# Patient Record
Sex: Male | Born: 1955 | Race: White | Hispanic: No | Marital: Single | Smoking: Never smoker
Health system: Southern US, Community
[De-identification: ages and names within clinical notes are randomized; demographics above are authoritative.]

## PROBLEM LIST (undated history)

## (undated) DIAGNOSIS — N310 Uninhibited neuropathic bladder, not elsewhere classified: Secondary | ICD-10-CM

## (undated) DIAGNOSIS — N189 Chronic kidney disease, unspecified: Secondary | ICD-10-CM

## (undated) DIAGNOSIS — G909 Disorder of the autonomic nervous system, unspecified: Secondary | ICD-10-CM

## (undated) DIAGNOSIS — D649 Anemia, unspecified: Secondary | ICD-10-CM

## (undated) DIAGNOSIS — G9340 Encephalopathy, unspecified: Secondary | ICD-10-CM

## (undated) DIAGNOSIS — R339 Retention of urine, unspecified: Secondary | ICD-10-CM

## (undated) DIAGNOSIS — J9690 Respiratory failure, unspecified, unspecified whether with hypoxia or hypercapnia: Secondary | ICD-10-CM

## (undated) DIAGNOSIS — L89153 Pressure ulcer of sacral region, stage 3: Secondary | ICD-10-CM

## (undated) DIAGNOSIS — J189 Pneumonia, unspecified organism: Secondary | ICD-10-CM

## (undated) DIAGNOSIS — R1310 Dysphagia, unspecified: Secondary | ICD-10-CM

## (undated) DIAGNOSIS — G8250 Quadriplegia, unspecified: Secondary | ICD-10-CM

## (undated) HISTORY — PX: TRACHEOSTOMY: SUR1362

---

## 2019-04-29 ENCOUNTER — Emergency Department (HOSPITAL_COMMUNITY)
Admission: EM | Admit: 2019-04-29 | Discharge: 2019-04-29 | Disposition: A | Discharge status: Home / Self Care | Payer: No Typology Code available for payment source | Attending: Emergency Medicine | Admitting: Emergency Medicine

## 2019-04-29 ENCOUNTER — Encounter (HOSPITAL_COMMUNITY): Payer: Self-pay | Admitting: Emergency Medicine

## 2019-04-29 ENCOUNTER — Emergency Department (HOSPITAL_COMMUNITY): Payer: No Typology Code available for payment source

## 2019-04-29 DIAGNOSIS — N3001 Acute cystitis with hematuria: Secondary | ICD-10-CM | POA: Diagnosis not present

## 2019-04-29 DIAGNOSIS — Z20828 Contact with and (suspected) exposure to other viral communicable diseases: Secondary | ICD-10-CM | POA: Diagnosis not present

## 2019-04-29 DIAGNOSIS — R0902 Hypoxemia: Secondary | ICD-10-CM | POA: Diagnosis present

## 2019-04-29 HISTORY — DX: Pneumonia, unspecified organism: J18.9

## 2019-04-29 HISTORY — DX: Quadriplegia, unspecified: G82.50

## 2019-04-29 HISTORY — DX: Anemia, unspecified: D64.9

## 2019-04-29 HISTORY — DX: Respiratory failure, unspecified, unspecified whether with hypoxia or hypercapnia: J96.90

## 2019-04-29 LAB — POCT I-STAT 7, (LYTES, BLD GAS, ICA,H+H)
Acid-Base Excess: 8 mmol/L — ABNORMAL HIGH (ref 0.0–2.0)
Bicarbonate: 31.8 mmol/L — ABNORMAL HIGH (ref 20.0–28.0)
Calcium, Ion: 1.25 mmol/L (ref 1.15–1.40)
HCT: 24 % — ABNORMAL LOW (ref 39.0–52.0)
Hemoglobin: 8.2 g/dL — ABNORMAL LOW (ref 13.0–17.0)
O2 Saturation: 95 %
Patient temperature: 97.5
Potassium: 4 mmol/L (ref 3.5–5.1)
Sodium: 143 mmol/L (ref 135–145)
TCO2: 33 mmol/L — ABNORMAL HIGH (ref 22–32)
pCO2 arterial: 39 mmHg (ref 32.0–48.0)
pH, Arterial: 7.517 — ABNORMAL HIGH (ref 7.350–7.450)
pO2, Arterial: 65 mmHg — ABNORMAL LOW (ref 83.0–108.0)

## 2019-04-29 LAB — URINALYSIS, ROUTINE W REFLEX MICROSCOPIC
Bilirubin Urine: NEGATIVE
Glucose, UA: NEGATIVE mg/dL
Hgb urine dipstick: NEGATIVE
Ketones, ur: NEGATIVE mg/dL
Nitrite: NEGATIVE
Protein, ur: 30 mg/dL — AB
Specific Gravity, Urine: 1.015 (ref 1.005–1.030)
WBC, UA: >50 WBC/hpf — ABNORMAL HIGH (ref 0–5)
pH: 8 (ref 5.0–8.0)

## 2019-04-29 LAB — COMPREHENSIVE METABOLIC PANEL
ALT: 82 U/L — ABNORMAL HIGH (ref 0–44)
AST: 38 U/L (ref 15–41)
Albumin: 3 g/dL — ABNORMAL LOW (ref 3.5–5.0)
Alkaline Phosphatase: 86 U/L (ref 38–126)
Anion gap: 16 — ABNORMAL HIGH (ref 5–15)
BUN: 58 mg/dL — ABNORMAL HIGH (ref 8–23)
CO2: 28 mmol/L (ref 22–32)
Calcium: 10 mg/dL (ref 8.9–10.3)
Chloride: 99 mmol/L (ref 98–111)
Creatinine, Ser: 0.57 mg/dL — ABNORMAL LOW (ref 0.61–1.24)
GFR calc Af Amer: >60 mL/min (ref >60)
GFR calc non Af Amer: >60 mL/min (ref >60)
Glucose, Bld: 99 mg/dL (ref 70–99)
Potassium: 4.3 mmol/L (ref 3.5–5.1)
Sodium: 143 mmol/L (ref 135–145)
Total Bilirubin: 0.4 mg/dL (ref 0.3–1.2)
Total Protein: 7.7 g/dL (ref 6.5–8.1)

## 2019-04-29 LAB — CBC WITH DIFFERENTIAL/PLATELET
Abs Immature Granulocytes: 0.11 10*3/uL — ABNORMAL HIGH (ref 0.00–0.07)
Basophils Absolute: 0 10*3/uL (ref 0.0–0.1)
Basophils Relative: 0 %
Eosinophils Absolute: 0.7 10*3/uL — ABNORMAL HIGH (ref 0.0–0.5)
Eosinophils Relative: 6 %
HCT: 27.8 % — ABNORMAL LOW (ref 39.0–52.0)
Hemoglobin: 8.5 g/dL — ABNORMAL LOW (ref 13.0–17.0)
Immature Granulocytes: 1 %
Lymphocytes Relative: 16 %
Lymphs Abs: 2 10*3/uL (ref 0.7–4.0)
MCH: 30.2 pg (ref 26.0–34.0)
MCHC: 30.6 g/dL (ref 30.0–36.0)
MCV: 98.9 fL (ref 80.0–100.0)
Monocytes Absolute: 0.9 10*3/uL (ref 0.1–1.0)
Monocytes Relative: 7 %
Neutro Abs: 8.5 10*3/uL — ABNORMAL HIGH (ref 1.7–7.7)
Neutrophils Relative %: 70 %
Platelets: 204 10*3/uL (ref 150–400)
RBC: 2.81 MIL/uL — ABNORMAL LOW (ref 4.22–5.81)
RDW: 16.5 % — ABNORMAL HIGH (ref 11.5–15.5)
WBC: 12.2 10*3/uL — ABNORMAL HIGH (ref 4.0–10.5)
nRBC: 0 % (ref 0.0–0.2)

## 2019-04-29 LAB — SARS CORONAVIRUS 2 BY RT PCR (HOSPITAL ORDER, PERFORMED IN ~~LOC~~ HOSPITAL LAB): SARS Coronavirus 2: NEGATIVE

## 2019-04-29 LAB — LACTIC ACID, PLASMA: Lactic Acid, Venous: 1.1 mmol/L (ref 0.5–1.9)

## 2019-04-29 MED ORDER — VANCOMYCIN HCL IN DEXTROSE 1-5 GM/200ML-% IV SOLN
1000.0000 mg | Freq: Once | INTRAVENOUS | Status: AC
  Administered 2019-04-29: 06:00:00 1000 mg via INTRAVENOUS
  Filled 2019-04-29: qty 200

## 2019-04-29 MED ORDER — SODIUM CHLORIDE 0.9 % IV BOLUS (SEPSIS)
1000.0000 mL | Freq: Once | INTRAVENOUS | Status: AC
  Administered 2019-04-29: 06:00:00 1000 mL via INTRAVENOUS

## 2019-04-29 MED ORDER — CIPROFLOXACIN HCL 500 MG PO TABS: 500.0000 mg | ORAL_TABLET | Freq: Two times a day (BID) | ORAL | 0 refills | Status: DC

## 2019-04-29 MED ORDER — PIPERACILLIN-TAZOBACTAM 3.375 G IVPB 30 MIN
3.3750 g | Freq: Once | INTRAVENOUS | Status: AC
  Administered 2019-04-29: 06:00:00 3.375 g via INTRAVENOUS
  Filled 2019-04-29: qty 50

## 2019-04-29 NOTE — ED Notes (Signed)
Called patient brother [phone] updated on status and aware patient will be returning to [facility]
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
## 2019-04-29 NOTE — ED Triage Notes (Signed)
Patient arrived with [facility] from [facility] staff reported pt. desaturated with possible seizure episode " blank stare" last night , sent here for evaluation , pt. is ventilator dependent /quadriphlegic . Chest x-ray result done at [facility] shows mild bibasilar infiltrates.

## 2019-04-29 NOTE — Discharge Instructions (Addendum)
Patient has had normal blood pressures and normal oxygen saturations here in the emergency department.  His work-up was unremarkable other than mildly elevated white blood cell count of 12,000, hemoglobin of 8.5 and a mild urinary tract infection.  Blood and urine cultures have been sent.  Patient has received 2 L of IV fluids as well as IV vancomycin and Zosyn.  I feel he can be discharged back to [facility] nursing facility.  These episodes of passing out versus seizures sound more like syncopal events possibly related to hypotension.  Patient is concerned that his hypotension is related to not being able to eat and drink and would like for a swallow screen to be completed.  He has asked for me to request this from the on call physician at [facility].

## 2019-04-29 NOTE — ED Notes (Signed)
Pt has left with [facility]. [facility] informed pt is in route.

## 2019-04-30 LAB — URINE CULTURE: Culture: 20000 — AB

## 2019-05-01 ENCOUNTER — Telehealth: Payer: Self-pay | Admitting: *Deleted

## 2019-05-01 NOTE — Progress Notes (Signed)
ED Antimicrobial Stewardship Positive Culture Follow Up   Francis Osborne is an 63 y.o. male who presented to [facility] on 04/29/2019 with a chief complaint of hypoxia and possible seizure-like episode from [facility]. Patient had no documented symptoms of UTI. UA showed large leukocytes, negative nitrites, rare bacteria, 6-10 squamous epithelial cells, and > 50 WBC. Patient sent home on ciprofloxacin due to history of pseudomonas in urine before. Urine culture resulted showing patient grew 20K colonies/mL of yeast.  Plan: No change in treatment since patient had no UTI symptoms   Chief Complaint  Patient presents with  . Desaturated on [facility]   . ? Seizure    Recent Results (from the past 720 hour(s))  Blood Culture (routine x 2)     Status: None (Preliminary result)   Collection Time: 04/29/19  5:20 AM  Result Value Ref Range Status   Specimen Description BLOOD RIGHT FOREARM  Final   Special Requests   Final    BOTTLES DRAWN AEROBIC AND ANAEROBIC Blood Culture results may not be optimal due to an inadequate volume of blood received in culture bottles   Culture   Final    NO GROWTH 1 DAY Performed at [facility], [address], [place], [place] [address]    Report Status PENDING  Incomplete  Urine culture     Status: Abnormal   Collection Time: 04/29/19  5:31 AM  Result Value Ref Range Status   Specimen Description URINE, RANDOM  Final   Special Requests   Final    NONE Performed at [facility], [address] [place], [place] [address]    Culture 20,000 COLONIES/mL YEAST (A)  Final   Report Status 04/30/2019 FINAL  Final  SARS Coronavirus 2 [facility] order, Performed in [facility] hospital lab)     Status: None   Collection Time: 04/29/19  5:32 AM  Result Value Ref Range Status   SARS Coronavirus 2 NEGATIVE NEGATIVE Final    Comment: (NOTE) If result is NEGATIVE SARS-CoV-2 target nucleic acids are NOT DETECTED. The SARS-CoV-2 RNA is generally  detectable in upper and lower  respiratory specimens during the acute phase of infection. The lowest  concentration of SARS-CoV-2 viral copies this assay can detect is 250  copies / mL. A negative result does not preclude SARS-CoV-2 infection  and should not be used as the sole basis for treatment or other  patient management decisions.  A negative result may occur with  improper specimen collection / handling, submission of specimen other  than nasopharyngeal swab, presence of viral mutation(s) within the  areas targeted by this assay, and inadequate number of viral copies  (<250 copies / mL). A negative result must be combined with clinical  observations, patient history, and epidemiological information. If result is POSITIVE SARS-CoV-2 target nucleic acids are DETECTED. The SARS-CoV-2 RNA is generally detectable in upper and lower  respiratory specimens dur ing the acute phase of infection.  Positive  results are indicative of active infection with SARS-CoV-2.  Clinical  correlation with patient history and other diagnostic information is  necessary to determine patient infection status.  Positive results do  not rule out bacterial infection or co-infection with other viruses. If result is PRESUMPTIVE POSTIVE SARS-CoV-2 nucleic acids MAY BE PRESENT.   A presumptive positive result was obtained on the submitted specimen  and confirmed on repeat testing.  While 2019 novel coronavirus  (SARS-CoV-2) nucleic acids may be present in the  submitted sample  additional confirmatory testing may be necessary for epidemiological  and / or clinical management purposes  to differentiate between  SARS-CoV-2 and other Sarbecovirus currently known to infect humans.  If clinically indicated additional testing with an alternate test  methodology [id number]) is advised. The SARS-CoV-2 RNA is generally  detectable in upper and lower respiratory sp ecimens during the acute  phase of infection. The  expected result is Negative. Fact Sheet for Patients:  BoilerBrush.com.cyhttps://www.fda.gov/media/136312/download Fact Sheet for Healthcare Providers: https://pope.com/https://www.fda.gov/media/136313/download This test is not yet approved or cleared by the [place] FDA and has been authorized for detection and/or diagnosis of SARS-CoV-2 by FDA under an Emergency Use Authorization (EUA).  This EUA will remain in effect (meaning this test can be used) for the duration of the COVID-19 declaration under Section 564(b)(1) of the Act, 21 U.S.C. section 360bbb-3(b)(1), unless the authorization is terminated or revoked sooner. Performed at [facility], [address] [place], [place] [address]   Blood Culture (routine x 2)     Status: None (Preliminary result)   Collection Time: 04/29/19  5:36 AM  Result Value Ref Range Status   Specimen Description BLOOD LEFT HAND  Final   Special Requests   Final    BOTTLES DRAWN AEROBIC ONLY Blood Culture results may not be optimal due to an inadequate volume of blood received in culture bottles   Culture   Final    NO GROWTH 1 DAY Performed at [facility], [address] [place], [place] [address]    Report Status PENDING  Incomplete    ED Provider: Eyvonne MechanicJeffrey Hedges, PA-C  Thank you for allowing pharmacy to be a part of this patient's care.  Lenord Carboebecca Kainoah Bartosiewicz, PharmD PGY1 Pharmacy Resident Phone: [phone]  Please check AMION for all [facility] phone numbers 05/01/2019, 8:27 AM

## 2019-05-01 NOTE — Telephone Encounter (Signed)
Post ED Visit - Positive Culture Follow-up  Culture report reviewed by antimicrobial stewardship pharmacist: [facility] []  Enzo Bi, Pharm.D. []  Celedonio Miyamoto, [address].D., BCPS AQ-ID []  Garvin Fila, Pharm.D., BCPS []  Georgina Pillion, Pharm.D., BCPS []  [place]aid, [address]D., BCPS, AAHIVP []  Estella Husk, Pharm.D., BCPS, AAHIVP []  Lysle Pearl, PharmD, BCPS []  Phillips Climes, PharmD, BCPS []  Agapito Games, PharmD, BCPS []  Verlan Friends, PharmD []  Mervyn Gay, PharmD, BCPS []  Vinnie Level, PharmD Lenord Carbo, PharmD  [facility] []  Len Childs, PharmD []  Greer Pickerel, PharmD []  Adalberto Cole, PharmD []  Perlie Gold, Rph []  Lonell Face) Jean Rosenthal, PharmD []  Earl Many, PharmD []  Junita Push, PharmD []  Dorna Leitz, PharmD []  Terrilee Files, PharmD []  Lynann Beaver, PharmD []  Keturah Barre, PharmD []  Loralee Pacas, PharmD []  Bernadene Person, PharmD   Positive urine culture Treated with Ciprofloxacin HCL, organism sensitive to the same and no further patient follow-up is required at this time.  Virl Axe [facility] 05/01/2019, 8:35 AM

## 2019-05-04 LAB — CULTURE, BLOOD (ROUTINE X 2)
Culture: NO GROWTH
Culture: NO GROWTH

## 2019-05-07 ENCOUNTER — Encounter (HOSPITAL_COMMUNITY): Payer: Self-pay | Admitting: Emergency Medicine

## 2019-05-07 ENCOUNTER — Other Ambulatory Visit: Payer: Self-pay

## 2019-05-07 ENCOUNTER — Emergency Department (HOSPITAL_COMMUNITY)
Admission: EM | Admit: 2019-05-07 | Discharge: 2019-05-07 | Disposition: A | Discharge status: Home / Self Care | Payer: Medicare Other | Source: Home / Self Care | Attending: Emergency Medicine | Admitting: Emergency Medicine

## 2019-05-07 ENCOUNTER — Emergency Department (HOSPITAL_COMMUNITY): Payer: Medicare Other

## 2019-05-07 DIAGNOSIS — Z9359 Other cystostomy status: Secondary | ICD-10-CM | POA: Diagnosis not present

## 2019-05-07 DIAGNOSIS — Z931 Gastrostomy status: Secondary | ICD-10-CM | POA: Diagnosis not present

## 2019-05-07 DIAGNOSIS — L89159 Pressure ulcer of sacral region, unspecified stage: Secondary | ICD-10-CM | POA: Diagnosis not present

## 2019-05-07 DIAGNOSIS — J189 Pneumonia, unspecified organism: Secondary | ICD-10-CM | POA: Diagnosis present

## 2019-05-07 DIAGNOSIS — G8250 Quadriplegia, unspecified: Secondary | ICD-10-CM | POA: Insufficient documentation

## 2019-05-07 DIAGNOSIS — Z930 Tracheostomy status: Secondary | ICD-10-CM | POA: Diagnosis not present

## 2019-05-07 DIAGNOSIS — R0902 Hypoxemia: Secondary | ICD-10-CM

## 2019-05-07 DIAGNOSIS — T17990A Other foreign object in respiratory tract, part unspecified in causing asphyxiation, initial encounter: Secondary | ICD-10-CM | POA: Diagnosis not present

## 2019-05-07 DIAGNOSIS — S14105S Unspecified injury at C5 level of cervical spinal cord, sequela: Secondary | ICD-10-CM | POA: Diagnosis not present

## 2019-05-07 DIAGNOSIS — D638 Anemia in other chronic diseases classified elsewhere: Secondary | ICD-10-CM | POA: Diagnosis not present

## 2019-05-07 DIAGNOSIS — R197 Diarrhea, unspecified: Secondary | ICD-10-CM | POA: Diagnosis not present

## 2019-05-07 DIAGNOSIS — Z20828 Contact with and (suspected) exposure to other viral communicable diseases: Secondary | ICD-10-CM | POA: Diagnosis not present

## 2019-05-07 DIAGNOSIS — I9589 Other hypotension: Secondary | ICD-10-CM | POA: Diagnosis not present

## 2019-05-07 DIAGNOSIS — E119 Type 2 diabetes mellitus without complications: Secondary | ICD-10-CM | POA: Diagnosis not present

## 2019-05-07 DIAGNOSIS — F419 Anxiety disorder, unspecified: Secondary | ICD-10-CM | POA: Diagnosis not present

## 2019-05-07 DIAGNOSIS — Z9911 Dependence on respirator [ventilator] status: Secondary | ICD-10-CM | POA: Diagnosis not present

## 2019-05-07 DIAGNOSIS — Y95 Nosocomial condition: Secondary | ICD-10-CM | POA: Diagnosis not present

## 2019-05-07 DIAGNOSIS — Z79899 Other long term (current) drug therapy: Secondary | ICD-10-CM | POA: Insufficient documentation

## 2019-05-07 DIAGNOSIS — J9621 Acute and chronic respiratory failure with hypoxia: Secondary | ICD-10-CM | POA: Diagnosis not present

## 2019-05-07 LAB — URINALYSIS, ROUTINE W REFLEX MICROSCOPIC
Bilirubin Urine: NEGATIVE
Glucose, UA: NEGATIVE mg/dL
Hgb urine dipstick: NEGATIVE
Ketones, ur: NEGATIVE mg/dL
Nitrite: NEGATIVE
Protein, ur: 30 mg/dL — AB
Specific Gravity, Urine: 1.016 (ref 1.005–1.030)
WBC, UA: >50 WBC/hpf — ABNORMAL HIGH (ref 0–5)
pH: 8 (ref 5.0–8.0)

## 2019-05-07 LAB — CBC WITH DIFFERENTIAL/PLATELET
Abs Immature Granulocytes: 0.09 10*3/uL — ABNORMAL HIGH (ref 0.00–0.07)
Basophils Absolute: 0 10*3/uL (ref 0.0–0.1)
Basophils Relative: 0 %
Eosinophils Absolute: 0.4 10*3/uL (ref 0.0–0.5)
Eosinophils Relative: 2 %
HCT: 29.7 % — ABNORMAL LOW (ref 39.0–52.0)
Hemoglobin: 8.8 g/dL — ABNORMAL LOW (ref 13.0–17.0)
Immature Granulocytes: 1 %
Lymphocytes Relative: 8 %
Lymphs Abs: 1.4 10*3/uL (ref 0.7–4.0)
MCH: 29.7 pg (ref 26.0–34.0)
MCHC: 29.6 g/dL — ABNORMAL LOW (ref 30.0–36.0)
MCV: 100.3 fL — ABNORMAL HIGH (ref 80.0–100.0)
Monocytes Absolute: 1.2 10*3/uL — ABNORMAL HIGH (ref 0.1–1.0)
Monocytes Relative: 7 %
Neutro Abs: 14.1 10*3/uL — ABNORMAL HIGH (ref 1.7–7.7)
Neutrophils Relative %: 82 %
Platelets: 196 10*3/uL (ref 150–400)
RBC: 2.96 MIL/uL — ABNORMAL LOW (ref 4.22–5.81)
RDW: 16.1 % — ABNORMAL HIGH (ref 11.5–15.5)
WBC: 17.2 10*3/uL — ABNORMAL HIGH (ref 4.0–10.5)
nRBC: 0 % (ref 0.0–0.2)

## 2019-05-07 LAB — LACTIC ACID, PLASMA: Lactic Acid, Venous: 1.6 mmol/L (ref 0.5–1.9)

## 2019-05-07 LAB — COMPREHENSIVE METABOLIC PANEL
ALT: 69 U/L — ABNORMAL HIGH (ref 0–44)
AST: 64 U/L — ABNORMAL HIGH (ref 15–41)
Albumin: 2.9 g/dL — ABNORMAL LOW (ref 3.5–5.0)
Alkaline Phosphatase: 85 U/L (ref 38–126)
Anion gap: 9 (ref 5–15)
BUN: 62 mg/dL — ABNORMAL HIGH (ref 8–23)
CO2: 27 mmol/L (ref 22–32)
Calcium: 9.7 mg/dL (ref 8.9–10.3)
Chloride: 105 mmol/L (ref 98–111)
Creatinine, Ser: 0.71 mg/dL (ref 0.61–1.24)
GFR calc Af Amer: >60 mL/min (ref >60)
GFR calc non Af Amer: >60 mL/min (ref >60)
Glucose, Bld: 117 mg/dL — ABNORMAL HIGH (ref 70–99)
Potassium: 5.4 mmol/L — ABNORMAL HIGH (ref 3.5–5.1)
Sodium: 141 mmol/L (ref 135–145)
Total Bilirubin: 0.6 mg/dL (ref 0.3–1.2)
Total Protein: 7.5 g/dL (ref 6.5–8.1)

## 2019-05-07 NOTE — ED Provider Notes (Signed)
[facility] EMERGENCY DEPARTMENT Provider Note   CSN: [id number] Arrival date & time: 05/07/19  0807    History   Chief Complaint Chief Complaint  Patient presents with   Respiratory Distress    HPI Francis Osborne is a 63 y.o. male.     Patient brought in by EMS from [facility].  Patient is a quadriplegic.  Has a trach.  Long standing vent dependent patient.  Patient was seen a week ago for similar presentation of hypoxia.  Work-up at that time was negative.  Including COVID testing.  Patient had blood cultures done and urine culture done no significant growth on those.  Patient this morning noted to have hypoxia.  Sent in by EMS.  EMS suctioned him oxygen sats improved but when he arrived here he was hypoxic again but in no distress.  Placed on ventilatory settings including 100% oxygen FiO2 and oxygen sats were okay.  Patient difficult to communicate with because he is nonverbal secondary to the trach.  But patient is alert and seems to be at baseline from a mental status standpoint in a neurologic standpoint.     Past Medical History:  Diagnosis Date   Anemia    Pneumonia    Quadriplegia (HCC)    Respiratory failure (HCC)     There are no active problems to display for this patient.   Past Surgical History:  Procedure Laterality Date   TRACHEOSTOMY          Home Medications    Prior to Admission medications   Medication Sig Start Date End Date Taking? Authorizing Provider  acetaminophen (TYLENOL) 325 MG tablet Place 650 mg into feeding tube every 4 (four) hours as needed for mild pain, moderate pain or headache.   Yes [provider]  Amino Acids-Protein Hydrolys (FEEDING SUPPLEMENT, PRO-STAT SUGAR FREE 64,) LIQD Place 30 mLs into feeding tube 3 (three) times daily with meals.   Yes [provider]  Baclofen 5 MG TABS Place 5 mg into feeding tube 3 (three) times daily.   Yes [provider]  chlorhexidine (PERIDEX)  0.12 % solution Use as directed 15 mLs in the mouth or throat See admin instructions. By shift   Yes [provider]  ciprofloxacin (CIPRO) 500 MG tablet Take 1 tablet (500 mg total) by mouth every 12 (twelve) hours. Patient taking differently: Place 500 mg into feeding tube every 12 (twelve) hours.  04/29/19  Yes Ward, Layla Maw, DO  clonazePAM (KLONOPIN) 0.5 MG tablet Place 0.5 mg into feeding tube every 12 (twelve) hours as needed for anxiety.    Yes [provider]  fludrocortisone (FLORINEF) 0.1 MG tablet Place 0.1 mg into feeding tube 2 (two) times a week. Monday and Thursday   Yes [provider]  glycopyrrolate (ROBINUL) 1 MG tablet Place 1 mg into feeding tube every 8 (eight) hours.    Yes [provider]  hydrALAZINE (APRESOLINE) 20 MG/ML injection Inject into the vein every 4 (four) hours as needed (Systolic blood pressure greater than). 0.95 ml   Yes [provider]  ibuprofen (ADVIL) 100 MG/5ML suspension Place 200 mg into feeding tube every 6 (six) hours.   Yes [provider]  magnesium oxide (MAG-OX) 400 MG tablet Place 400 mg into feeding tube 2 (two) times daily.   Yes [provider]  midodrine (PROAMATINE) 2.5 MG tablet Place 2.5 mg into feeding tube 2 (two) times daily with a meal.   Yes [provider]  Multiple Vitamins-Minerals (MULTIVITAMIN ADULT PO) 1 tablet by Gastric Tube route daily.   Yes [provider]  oxyCODONE-acetaminophen (PERCOCET/ROXICET) 5-325 MG tablet Place 1 tablet into feeding tube daily.   Yes [provider]  scopolamine (TRANSDERM-SCOP) 1 MG/3DAYS Place 1 patch onto the skin every 3 (three) days.   Yes [provider]  sodium bicarbonate 325 MG tablet Place 650 mg into feeding tube 4 (four) times daily.   Yes [provider]  vitamin C (ASCORBIC ACID) 500 MG tablet Place 500 mg into feeding tube daily.   Yes [provider]    Family  History No family history on file.  Social History Social History   Tobacco Use   Smoking status: Never Smoker   Smokeless tobacco: Never Used  Substance Use Topics   Alcohol use: Not Currently   Drug use: Never     Allergies   Contrast media [iodinated diagnostic agents] and Iodine   Review of Systems Review of Systems  Unable to perform ROS: Patient nonverbal     Physical Exam Updated Vital Signs BP (!) 173/96    Pulse (!) 107    Temp 99.2 F (37.3 C) (Rectal)    Resp (!) 29    Ht 1.829 m (6')    Wt 81.6 kg    SpO2 96%    BMI 24.41 kg/m   Physical Exam Vitals signs and nursing note reviewed.  Constitutional:      General: He is not in acute distress.    Appearance: Normal appearance. He is well-developed.  HENT:     Head: Normocephalic and atraumatic.  Eyes:     Extraocular Movements: Extraocular movements intact.     Conjunctiva/sclera: Conjunctivae normal.     Pupils: Pupils are equal, round, and reactive to light.  Neck:     Musculoskeletal: Neck supple.  Cardiovascular:     Rate and Rhythm: Normal rate and regular rhythm.     Heart sounds: No murmur.  Pulmonary:     Effort: Pulmonary effort is normal. No respiratory distress.     Breath sounds: Rhonchi present.  Abdominal:     General: Bowel sounds are normal. There is no distension.     Palpations: Abdomen is soft.     Tenderness: There is no abdominal tenderness.     Comments: Suprapubic catheter in place.  Left upper quadrant with G-tube in place.  No evidence of any significant skin infection surrounding these areas.  Musculoskeletal:        General: No swelling.  Skin:    General: Skin is warm and dry.     Capillary Refill: Capillary refill takes less than 2 seconds.  Neurological:     Mental Status: He is alert. Mental status is at baseline.      ED Treatments / Results  Labs (all labs ordered are listed, but only abnormal results are displayed) Labs Reviewed  CBC WITH  DIFFERENTIAL/PLATELET - Abnormal; Notable for the following components:      Result Value   WBC 17.2 (*)    RBC 2.96 (*)    Hemoglobin 8.8 (*)    HCT 29.7 (*)    MCV 100.3 (*)    MCHC 29.6 (*)    RDW 16.1 (*)    Neutro Abs 14.1 (*)    Monocytes Absolute 1.2 (*)    Abs Immature Granulocytes 0.09 (*)    All other components within normal limits  COMPREHENSIVE METABOLIC PANEL - Abnormal; Notable for the following components:  Potassium 5.4 (*)    Glucose, Bld 117 (*)    BUN 62 (*)    Albumin 2.9 (*)    AST 64 (*)    ALT 69 (*)    All other components within normal limits  URINALYSIS, ROUTINE W REFLEX MICROSCOPIC - Abnormal; Notable for the following components:   APPearance CLOUDY (*)    Protein, ur 30 (*)    Leukocytes,Ua LARGE (*)    WBC, UA >50 (*)    Bacteria, UA RARE (*)    Non Squamous Epithelial 6-10 (*)    All other components within normal limits  CULTURE, BLOOD (ROUTINE X 2)  CULTURE, BLOOD (ROUTINE X 2)  URINE CULTURE  LACTIC ACID, PLASMA    EKG EKG Interpretation  Date/Time:  Sunday May 07 2019 08:17:40 EDT Ventricular Rate:  91 PR Interval:    QRS Duration: 100 QT Interval:  362 QTC Calculation: 446 R Axis:   113 Text Interpretation:  Sinus rhythm Atrial premature complexes Right axis deviation Nonspecific T abnormalities, lateral leads Minimal ST elevation, inferior leads Confirmed by Vanetta Mulders [phone]) on 05/07/2019 8:46:14 AM Also confirmed by Vanetta Mulders [phone]), editor Barbette Hair [phone])  on 05/07/2019 9:47:04 AM   Radiology Dg Chest Port 1 View  Result Date: 05/07/2019 CLINICAL DATA:  Hypoxia. EXAM: PORTABLE CHEST 1 VIEW COMPARISON:  Apr 29, 2019 FINDINGS: A left PICC line terminates in good position. No pneumothorax. Small effusions, left greater than right, stable on the right and mildly increased on the left. Mild opacity in left base is likely atelectasis. Infiltrate less likely. No change in the cardiomediastinal silhouette. The  tracheostomy tube projects over the tracheal air column. IMPRESSION: 1. Small bilateral effusions, left greater than right, stable on the right and slightly increased on the left in the interval. Mild increased opacity in left base in the interval is favored represent atelectasis rather than developing infiltrate. Electronically Signed   By: Gerome [name] III M.D   On: 05/07/2019 08:52    Procedures Procedures (including critical care time)  Medications Ordered in ED Medications - No data to display   Initial Impression / Assessment and Plan / ED Course  I have reviewed the triage vital signs and the nursing notes.  Pertinent labs & imaging results that were available during my care of the patient were reviewed by me and considered in my medical decision making (see chart for details).       Patient upon arrival was requiring 100% oxygen on the vent.  To maintain sats in the low 90s.  Chest x-ray without acute findings.  Lactic acid not elevated.  Another set of blood cultures and urine culture have been sent and are pending.  However with extensive suctioning on the part of respiratory therapy heavy mucus secretions were eventually cleared.  And patient was able to be readjusted back on the vent to his normal FiO2 settings.  Patient stable for discharge back to [facility].  No evidence of any acute infection ongoing today.  COVID-19 testing not done today.  But was negative a week ago.   Final Clinical Impressions(s) / ED Diagnoses   Final diagnoses:  Hypoxia    ED Discharge Orders    None       Vanetta Mulders, MD 05/07/19 1342

## 2019-05-07 NOTE — ED Triage Notes (Signed)
Pt BIB [facility] for low oxygen saturations while on the vent at [facility]. EMS reports that the facility called for oxygen saturations in the 60's. EMS reports clear lung sounds. Vital signs stable with normal oxygen saturation levels, and no changes while in route. EMS reports 12 lead unremarkable and a PICC line in the left brachial artery.   Pt non-verbal and unable to tell what is going on.

## 2019-05-07 NOTE — ED Notes (Signed)
Contacted pts brother, Yeltsin Hishmeh at [phone], and advised him that his brother was here in the Emergency Room. Advised pt's brother that everything was going well with to this point and we were waiting on blood work to come back however it was only sent off so results would take some time to come back. Pt's brother asked that staff call him when a disposition for the patient is available.

## 2019-05-07 NOTE — ED Notes (Signed)
Pt sister Jamie Kato [phone]

## 2019-05-07 NOTE — Discharge Instructions (Addendum)
Work-up here today suggestive of heavy mucus secretions and when suctioned well patient eventually got back to baseline on his ventilator settings.  And blood pressure was fine as well.  Blood cultures are pending.  Lactic acid was normal.  Chest x-ray without acute finding.  Patient stable to return back to [facility].

## 2019-05-08 ENCOUNTER — Emergency Department (HOSPITAL_COMMUNITY): Payer: Medicare Other

## 2019-05-08 ENCOUNTER — Inpatient Hospital Stay (HOSPITAL_COMMUNITY)
Admission: EM | Admit: 2019-05-08 | Discharge: 2019-05-16 | DRG: 207 | Disposition: A | Discharge status: Other Acute Inpatient Hospital | Payer: Medicare Other | Source: Other Acute Inpatient Hospital | Attending: Internal Medicine | Admitting: Internal Medicine

## 2019-05-08 DIAGNOSIS — R1310 Dysphagia, unspecified: Secondary | ICD-10-CM | POA: Diagnosis not present

## 2019-05-08 DIAGNOSIS — Z9359 Other cystostomy status: Secondary | ICD-10-CM

## 2019-05-08 DIAGNOSIS — J9621 Acute and chronic respiratory failure with hypoxia: Secondary | ICD-10-CM | POA: Diagnosis present

## 2019-05-08 DIAGNOSIS — Z1619 Resistance to other specified beta lactam antibiotics: Secondary | ICD-10-CM | POA: Diagnosis not present

## 2019-05-08 DIAGNOSIS — E119 Type 2 diabetes mellitus without complications: Secondary | ICD-10-CM | POA: Diagnosis present

## 2019-05-08 DIAGNOSIS — L89159 Pressure ulcer of sacral region, unspecified stage: Secondary | ICD-10-CM | POA: Diagnosis present

## 2019-05-08 DIAGNOSIS — Y95 Nosocomial condition: Secondary | ICD-10-CM | POA: Diagnosis present

## 2019-05-08 DIAGNOSIS — J189 Pneumonia, unspecified organism: Secondary | ICD-10-CM | POA: Diagnosis present

## 2019-05-08 DIAGNOSIS — F419 Anxiety disorder, unspecified: Secondary | ICD-10-CM | POA: Diagnosis present

## 2019-05-08 DIAGNOSIS — I9589 Other hypotension: Secondary | ICD-10-CM | POA: Diagnosis present

## 2019-05-08 DIAGNOSIS — Z930 Tracheostomy status: Secondary | ICD-10-CM | POA: Diagnosis not present

## 2019-05-08 DIAGNOSIS — N2589 Other disorders resulting from impaired renal tubular function: Secondary | ICD-10-CM | POA: Diagnosis not present

## 2019-05-08 DIAGNOSIS — G8250 Quadriplegia, unspecified: Secondary | ICD-10-CM | POA: Diagnosis present

## 2019-05-08 DIAGNOSIS — T17990A Other foreign object in respiratory tract, part unspecified in causing asphyxiation, initial encounter: Secondary | ICD-10-CM | POA: Diagnosis present

## 2019-05-08 DIAGNOSIS — J9690 Respiratory failure, unspecified, unspecified whether with hypoxia or hypercapnia: Secondary | ICD-10-CM

## 2019-05-08 DIAGNOSIS — Z9911 Dependence on respirator [ventilator] status: Secondary | ICD-10-CM | POA: Diagnosis not present

## 2019-05-08 DIAGNOSIS — A048 Other specified bacterial intestinal infections: Secondary | ICD-10-CM | POA: Diagnosis not present

## 2019-05-08 DIAGNOSIS — S14105S Unspecified injury at C5 level of cervical spinal cord, sequela: Secondary | ICD-10-CM | POA: Diagnosis not present

## 2019-05-08 DIAGNOSIS — Z931 Gastrostomy status: Secondary | ICD-10-CM | POA: Diagnosis not present

## 2019-05-08 DIAGNOSIS — L8990 Pressure ulcer of unspecified site, unspecified stage: Secondary | ICD-10-CM

## 2019-05-08 DIAGNOSIS — Z20828 Contact with and (suspected) exposure to other viral communicable diseases: Secondary | ICD-10-CM | POA: Diagnosis present

## 2019-05-08 DIAGNOSIS — R002 Palpitations: Secondary | ICD-10-CM | POA: Diagnosis not present

## 2019-05-08 DIAGNOSIS — R0902 Hypoxemia: Secondary | ICD-10-CM | POA: Diagnosis not present

## 2019-05-08 DIAGNOSIS — F329 Major depressive disorder, single episode, unspecified: Secondary | ICD-10-CM | POA: Diagnosis not present

## 2019-05-08 DIAGNOSIS — R197 Diarrhea, unspecified: Secondary | ICD-10-CM | POA: Diagnosis present

## 2019-05-08 DIAGNOSIS — J95851 Ventilator associated pneumonia: Secondary | ICD-10-CM | POA: Diagnosis not present

## 2019-05-08 DIAGNOSIS — J9611 Chronic respiratory failure with hypoxia: Secondary | ICD-10-CM | POA: Diagnosis not present

## 2019-05-08 DIAGNOSIS — D638 Anemia in other chronic diseases classified elsewhere: Secondary | ICD-10-CM | POA: Diagnosis present

## 2019-05-08 DIAGNOSIS — J9620 Acute and chronic respiratory failure, unspecified whether with hypoxia or hypercapnia: Secondary | ICD-10-CM | POA: Diagnosis not present

## 2019-05-08 LAB — CBC WITH DIFFERENTIAL/PLATELET
Abs Immature Granulocytes: 0.22 10*3/uL — ABNORMAL HIGH (ref 0.00–0.07)
Basophils Absolute: 0 10*3/uL (ref 0.0–0.1)
Basophils Relative: 0 %
Eosinophils Absolute: 0.1 10*3/uL (ref 0.0–0.5)
Eosinophils Relative: 0 %
HCT: 27.8 % — ABNORMAL LOW (ref 39.0–52.0)
Hemoglobin: 8.7 g/dL — ABNORMAL LOW (ref 13.0–17.0)
Immature Granulocytes: 1 %
Lymphocytes Relative: 9 %
Lymphs Abs: 1.9 10*3/uL (ref 0.7–4.0)
MCH: 30.5 pg (ref 26.0–34.0)
MCHC: 31.3 g/dL (ref 30.0–36.0)
MCV: 97.5 fL (ref 80.0–100.0)
Monocytes Absolute: 1.6 10*3/uL — ABNORMAL HIGH (ref 0.1–1.0)
Monocytes Relative: 7 %
Neutro Abs: 18.8 10*3/uL — ABNORMAL HIGH (ref 1.7–7.7)
Neutrophils Relative %: 83 %
Platelets: 174 10*3/uL (ref 150–400)
RBC: 2.85 MIL/uL — ABNORMAL LOW (ref 4.22–5.81)
RDW: 16.3 % — ABNORMAL HIGH (ref 11.5–15.5)
WBC: 22.6 10*3/uL — ABNORMAL HIGH (ref 4.0–10.5)
nRBC: 0 % (ref 0.0–0.2)

## 2019-05-08 LAB — URINALYSIS, ROUTINE W REFLEX MICROSCOPIC
Bilirubin Urine: NEGATIVE
Glucose, UA: NEGATIVE mg/dL
Hgb urine dipstick: NEGATIVE
Ketones, ur: NEGATIVE mg/dL
Nitrite: NEGATIVE
Protein, ur: 30 mg/dL — AB
Specific Gravity, Urine: 1.016 (ref 1.005–1.030)
pH: 8 (ref 5.0–8.0)

## 2019-05-08 LAB — POCT I-STAT 7, (LYTES, BLD GAS, ICA,H+H)
Acid-Base Excess: 2 mmol/L (ref 0.0–2.0)
Bicarbonate: 24.7 mmol/L (ref 20.0–28.0)
Calcium, Ion: 1.18 mmol/L (ref 1.15–1.40)
HCT: 22 % — ABNORMAL LOW (ref 39.0–52.0)
Hemoglobin: 7.5 g/dL — ABNORMAL LOW (ref 13.0–17.0)
O2 Saturation: 100 %
Patient temperature: 99.3
Potassium: 4.1 mmol/L (ref 3.5–5.1)
Sodium: 146 mmol/L — ABNORMAL HIGH (ref 135–145)
TCO2: 26 mmol/L (ref 22–32)
pCO2 arterial: 29.8 mmHg — ABNORMAL LOW (ref 32.0–48.0)
pH, Arterial: 7.528 — ABNORMAL HIGH (ref 7.350–7.450)
pO2, Arterial: 173 mmHg — ABNORMAL HIGH (ref 83.0–108.0)

## 2019-05-08 LAB — LACTIC ACID, PLASMA: Lactic Acid, Venous: 1.3 mmol/L (ref 0.5–1.9)

## 2019-05-08 LAB — BASIC METABOLIC PANEL
Anion gap: 13 (ref 5–15)
BUN: 81 mg/dL — ABNORMAL HIGH (ref 8–23)
CO2: 24 mmol/L (ref 22–32)
Calcium: 9.1 mg/dL (ref 8.9–10.3)
Chloride: 108 mmol/L (ref 98–111)
Creatinine, Ser: 1.18 mg/dL (ref 0.61–1.24)
GFR calc Af Amer: >60 mL/min (ref >60)
GFR calc non Af Amer: >60 mL/min (ref >60)
Glucose, Bld: 117 mg/dL — ABNORMAL HIGH (ref 70–99)
Potassium: 4.8 mmol/L (ref 3.5–5.1)
Sodium: 145 mmol/L (ref 135–145)

## 2019-05-08 LAB — GLUCOSE, CAPILLARY
Glucose-Capillary: 97 mg/dL (ref 70–99)
Glucose-Capillary: 96 mg/dL (ref 70–99)

## 2019-05-08 LAB — SARS CORONAVIRUS 2 BY RT PCR (HOSPITAL ORDER, PERFORMED IN ~~LOC~~ HOSPITAL LAB): SARS Coronavirus 2: NEGATIVE

## 2019-05-08 LAB — URINE CULTURE: Culture: 30000 — AB

## 2019-05-08 LAB — PROCALCITONIN: Procalcitonin: 0.69 ng/mL

## 2019-05-08 MED ORDER — FLUDROCORTISONE ACETATE 0.1 MG PO TABS
0.1000 mg | ORAL_TABLET | ORAL | Status: DC
  Administered 2019-05-11 – 2019-05-15 (×2): 0.1 mg
  Filled 2019-05-08 (×3): qty 1

## 2019-05-08 MED ORDER — PIPERACILLIN-TAZOBACTAM 3.375 G IVPB 30 MIN
3.3750 g | Freq: Once | INTRAVENOUS | Status: AC
  Administered 2019-05-08: 3.375 g via INTRAVENOUS
  Filled 2019-05-08: qty 50

## 2019-05-08 MED ORDER — PRO-STAT SUGAR FREE PO LIQD
30.0000 mL | Freq: Three times a day (TID) | ORAL | Status: DC
  Administered 2019-05-09 – 2019-05-16 (×24): 30 mL
  Filled 2019-05-08 (×23): qty 30

## 2019-05-08 MED ORDER — SCOPOLAMINE 1 MG/3DAYS TD PT72
1.0000 | MEDICATED_PATCH | TRANSDERMAL | Status: DC
  Administered 2019-05-09 – 2019-05-15 (×3): 1.5 mg via TRANSDERMAL
  Filled 2019-05-08 (×3): qty 1

## 2019-05-08 MED ORDER — MIDODRINE HCL 2.5 MG PO TABS
2.5000 mg | ORAL_TABLET | Freq: Two times a day (BID) | ORAL | Status: DC
  Administered 2019-05-09 – 2019-05-11 (×5): 2.5 mg
  Filled 2019-05-08 (×9): qty 1

## 2019-05-08 MED ORDER — CLONAZEPAM 0.5 MG PO TABS
0.5000 mg | ORAL_TABLET | Freq: Two times a day (BID) | ORAL | Status: DC | PRN
  Administered 2019-05-10 – 2019-05-16 (×8): 0.5 mg
  Filled 2019-05-08 (×9): qty 1

## 2019-05-08 MED ORDER — ORAL CARE MOUTH RINSE
15.0000 mL | OROMUCOSAL | Status: DC
  Administered 2019-05-08 – 2019-05-16 (×76): 15 mL via OROMUCOSAL

## 2019-05-08 MED ORDER — PIPERACILLIN-TAZOBACTAM 3.375 G IVPB
3.3750 g | Freq: Three times a day (TID) | INTRAVENOUS | Status: DC
  Administered 2019-05-08 – 2019-05-13 (×15): 3.375 g via INTRAVENOUS
  Filled 2019-05-08 (×16): qty 50

## 2019-05-08 MED ORDER — ALTEPLASE 2 MG IJ SOLR
2.0000 mg | Freq: Once | INTRAMUSCULAR | Status: AC
  Administered 2019-05-08: 2 mg
  Filled 2019-05-08: qty 2

## 2019-05-08 MED ORDER — VITAMIN C 500 MG PO TABS
500.0000 mg | ORAL_TABLET | Freq: Every day | ORAL | Status: DC
  Administered 2019-05-09 – 2019-05-16 (×8): 500 mg
  Filled 2019-05-08 (×8): qty 1

## 2019-05-08 MED ORDER — VANCOMYCIN HCL 10 G IV SOLR
1500.0000 mg | Freq: Once | INTRAVENOUS | Status: AC
  Administered 2019-05-08: 13:00:00 1500 mg via INTRAVENOUS
  Filled 2019-05-08: qty 1500

## 2019-05-08 MED ORDER — BACLOFEN 5 MG HALF TABLET
5.0000 mg | ORAL_TABLET | Freq: Three times a day (TID) | ORAL | Status: DC
  Administered 2019-05-08 – 2019-05-16 (×24): 5 mg
  Filled 2019-05-08 (×25): qty 1

## 2019-05-08 MED ORDER — VANCOMYCIN HCL 500 MG IV SOLR
500.0000 mg | Freq: Once | INTRAVENOUS | Status: AC
  Administered 2019-05-08: 17:00:00 500 mg via INTRAVENOUS
  Filled 2019-05-08: qty 500

## 2019-05-08 MED ORDER — VANCOMYCIN HCL 10 G IV SOLR
1750.0000 mg | INTRAVENOUS | Status: DC
  Filled 2019-05-08: qty 1750

## 2019-05-08 MED ORDER — ENOXAPARIN SODIUM 40 MG/0.4ML ~~LOC~~ SOLN
40.0000 mg | SUBCUTANEOUS | Status: DC
  Administered 2019-05-08 – 2019-05-16 (×9): 40 mg via SUBCUTANEOUS
  Filled 2019-05-08 (×9): qty 0.4

## 2019-05-08 MED ORDER — OXYCODONE-ACETAMINOPHEN 5-325 MG PO TABS
1.0000 | ORAL_TABLET | Freq: Every day | ORAL | Status: DC
  Administered 2019-05-09 – 2019-05-16 (×8): 1
  Filled 2019-05-08 (×8): qty 1

## 2019-05-08 MED ORDER — SODIUM CHLORIDE 0.9 % IV SOLN
INTRAVENOUS | Status: DC
  Administered 2019-05-08 – 2019-05-09 (×2): via INTRAVENOUS

## 2019-05-08 MED ORDER — GLYCOPYRROLATE 1 MG PO TABS
1.0000 mg | ORAL_TABLET | Freq: Three times a day (TID) | ORAL | Status: DC
  Administered 2019-05-08 – 2019-05-16 (×24): 1 mg
  Filled 2019-05-08 (×24): qty 1

## 2019-05-08 MED ORDER — ACETAMINOPHEN 325 MG PO TABS
650.0000 mg | ORAL_TABLET | ORAL | Status: DC | PRN
  Administered 2019-05-09 – 2019-05-15 (×6): 650 mg
  Filled 2019-05-08 (×6): qty 2

## 2019-05-08 MED ORDER — MAGNESIUM OXIDE 400 (241.3 MG) MG PO TABS
400.0000 mg | ORAL_TABLET | Freq: Two times a day (BID) | ORAL | Status: DC
  Administered 2019-05-08 – 2019-05-16 (×16): 400 mg
  Filled 2019-05-08 (×17): qty 1

## 2019-05-08 MED ORDER — SODIUM CHLORIDE 0.9 % IV BOLUS
1000.0000 mL | Freq: Once | INTRAVENOUS | Status: AC
  Administered 2019-05-08: 1000 mL via INTRAVENOUS

## 2019-05-08 MED ORDER — SODIUM BICARBONATE 650 MG PO TABS
650.0000 mg | ORAL_TABLET | Freq: Four times a day (QID) | ORAL | Status: DC
  Administered 2019-05-08 – 2019-05-16 (×31): 650 mg
  Filled 2019-05-08 (×30): qty 1

## 2019-05-08 MED ORDER — IBUPROFEN 100 MG/5ML PO SUSP
200.0000 mg | Freq: Four times a day (QID) | ORAL | Status: DC
  Administered 2019-05-08 – 2019-05-09 (×2): 200 mg
  Filled 2019-05-08 (×3): qty 10

## 2019-05-08 MED ORDER — CHLORHEXIDINE GLUCONATE 0.12% ORAL RINSE (MEDLINE KIT)
15.0000 mL | Freq: Two times a day (BID) | OROMUCOSAL | Status: DC
  Administered 2019-05-08 – 2019-05-11 (×7): 15 mL via OROMUCOSAL

## 2019-05-08 MED ORDER — CHLORHEXIDINE GLUCONATE 0.12 % MT SOLN
15.0000 mL | Freq: Two times a day (BID) | OROMUCOSAL | Status: DC
  Administered 2019-05-09 – 2019-05-16 (×13): 15 mL via OROMUCOSAL
  Filled 2019-05-08 (×6): qty 15

## 2019-05-08 MED ORDER — SODIUM CHLORIDE 0.9 % IV BOLUS
500.0000 mL | Freq: Once | INTRAVENOUS | Status: AC
  Administered 2019-05-08: 500 mL via INTRAVENOUS
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
## 2019-05-08 NOTE — ED Provider Notes (Addendum)
[facility] EMERGENCY DEPARTMENT Provider Note   CSN: [id number] Arrival date & time: 05/08/19  1032    History   Chief Complaint Chief Complaint  Patient presents with  . Respiratory Distress    HPI Francis Osborne is a 64 y.o. male.     63 yo M with a chief complaint of hypoxia.  Reportedly the patient's oxygen saturation was down in the 70s.  Sent from [facility] for evaluation.  Patient denies any shortness of breath for me.  Limited history due to the patient being vented with a trach.  The history is provided by the patient.  Illness  Severity:  Moderate Onset quality:  Sudden Duration:  1 day Timing:  Constant Progression:  Unchanged Chronicity:  New Associated symptoms: no abdominal pain, no chest pain, no congestion, no diarrhea, no fever, no headaches, no myalgias, no rash, no shortness of breath and no vomiting     Past Medical History:  Diagnosis Date  . Anemia   . Pneumonia   . Quadriplegia (HCC)   . Respiratory failure (HCC)     There are no active problems to display for this patient.   Past Surgical History:  Procedure Laterality Date  . TRACHEOSTOMY          Home Medications    Prior to Admission medications   Medication Sig Start Date End Date Taking? Authorizing Provider  acetaminophen (TYLENOL) 325 MG tablet Place 650 mg into feeding tube every 4 (four) hours as needed for mild pain, moderate pain or headache.    [provider]  Amino Acids-Protein Hydrolys (FEEDING SUPPLEMENT, PRO-STAT SUGAR FREE 64,) LIQD Place 30 mLs into feeding tube 3 (three) times daily with meals.    [provider]  Baclofen 5 MG TABS Place 5 mg into feeding tube 3 (three) times daily.    [provider]  chlorhexidine (PERIDEX) 0.12 % solution Use as directed 15 mLs in the mouth or throat See admin instructions. By shift    [provider]  ciprofloxacin (CIPRO) 500 MG tablet Take 1 tablet (500 mg total) by  mouth every 12 (twelve) hours. Patient taking differently: Place 500 mg into feeding tube every 12 (twelve) hours.  04/29/19   Ward, Layla Maw, DO  clonazePAM (KLONOPIN) 0.5 MG tablet Place 0.5 mg into feeding tube every 12 (twelve) hours as needed for anxiety.     [provider]  fludrocortisone (FLORINEF) 0.1 MG tablet Place 0.1 mg into feeding tube 2 (two) times a week. Monday and Thursday    [provider]  glycopyrrolate (ROBINUL) 1 MG tablet Place 1 mg into feeding tube every 8 (eight) hours.     [provider]  hydrALAZINE (APRESOLINE) 20 MG/ML injection Inject into the vein every 4 (four) hours as needed (Systolic blood pressure greater than). 0.95 ml    [provider]  ibuprofen (ADVIL) 100 MG/5ML suspension Place 200 mg into feeding tube every 6 (six) hours.    [provider]  magnesium oxide (MAG-OX) 400 MG tablet Place 400 mg into feeding tube 2 (two) times daily.    [provider]  midodrine (PROAMATINE) 2.5 MG tablet Place 2.5 mg into feeding tube 2 (two) times daily with a meal.    [provider]  Multiple Vitamins-Minerals (MULTIVITAMIN ADULT PO) 1 tablet by Gastric Tube route daily.    [provider]  oxyCODONE-acetaminophen (PERCOCET/ROXICET) 5-325 MG tablet Place 1 tablet into feeding tube daily.    [provider]  scopolamine (TRANSDERM-SCOP) 1 MG/3DAYS Place 1 patch onto the skin every 3 (three) days.    [provider]  sodium bicarbonate 325 MG tablet Place 650 mg into feeding tube 4 (four) times daily.    [provider]  vitamin C (ASCORBIC ACID) 500 MG tablet Place 500 mg into feeding tube daily.    [provider]    Family History No family history on file.  Social History Social History   Tobacco Use  . Smoking status: Never Smoker  . Smokeless tobacco: Never Used  Substance Use Topics  . Alcohol use: Not Currently  . Drug use: Never      Allergies   Contrast media [iodinated diagnostic agents] and Iodine   Review of Systems Review of Systems  Constitutional: Negative for chills and fever.  HENT: Negative for congestion and facial swelling.   Eyes: Negative for discharge and visual disturbance.  Respiratory: Negative for shortness of breath.   Cardiovascular: Negative for chest pain and palpitations.  Gastrointestinal: Negative for abdominal pain, diarrhea and vomiting.  Musculoskeletal: Negative for arthralgias and myalgias.  Skin: Negative for color change and rash.  Neurological: Negative for tremors, syncope and headaches.  Psychiatric/Behavioral: Negative for confusion and dysphoric mood.     Physical Exam Updated Vital Signs BP 91/75   Pulse 92   Temp 99.3 F (37.4 C) (Oral)   Resp 19   SpO2 100%   Physical Exam Vitals signs and nursing note reviewed.  Constitutional:      Appearance: He is well-developed.  HENT:     Head: Normocephalic and atraumatic.  Eyes:     Pupils: Pupils are equal, round, and reactive to light.  Neck:     Musculoskeletal: Normal range of motion and neck supple.     Vascular: No JVD.  Cardiovascular:     Rate and Rhythm: Normal rate and regular rhythm.     Heart sounds: No murmur. No friction rub. No gallop.   Pulmonary:     Effort: No respiratory distress.     Breath sounds: No wheezing.  Abdominal:     General: There is no distension.     Tenderness: There is no guarding or rebound.  Musculoskeletal: Normal range of motion.  Skin:    Coloration: Skin is not pale.     Findings: No rash.  Neurological:     Mental Status: He is alert and oriented to person, place, and time.  Psychiatric:        Behavior: Behavior normal.      ED Treatments / Results  Labs (all labs ordered are listed, but only abnormal results are displayed) Labs Reviewed  CBC WITH DIFFERENTIAL/PLATELET - Abnormal; Notable for the following components:      Result Value   WBC 22.6 (*)     RBC 2.85 (*)    Hemoglobin 8.7 (*)    HCT 27.8 (*)    RDW 16.3 (*)    Neutro Abs 18.8 (*)    Monocytes Absolute 1.6 (*)    Abs Immature Granulocytes 0.22 (*)    All other components within normal limits  BASIC METABOLIC PANEL - Abnormal; Notable for the following components:   Glucose, Bld 117 (*)    BUN 81 (*)    All other components within normal limits  POCT I-STAT 7, (LYTES, BLD GAS, ICA,H+H) - Abnormal; Notable for the following components:   pH, Arterial 7.528 (*)    pCO2 arterial 29.8 (*)    pO2,  Arterial 173.0 (*)    Sodium 146 (*)    HCT 22.0 (*)    Hemoglobin 7.5 (*)    All other components within normal limits  CULTURE, BLOOD (ROUTINE X 2)  CULTURE, BLOOD (ROUTINE X 2)  URINE CULTURE  SARS CORONAVIRUS 2 (HOSPITAL ORDER, PERFORMED IN [facility])  CULTURE, RESPIRATORY  LACTIC ACID, PLASMA  LACTIC ACID, PLASMA  URINALYSIS, ROUTINE W REFLEX MICROSCOPIC  BLOOD GAS, ARTERIAL    EKG None  Radiology Dg Chest Port 1 View  Result Date: 05/08/2019 CLINICAL DATA:  Shortness of breath EXAM: PORTABLE CHEST 1 VIEW COMPARISON:  05/07/2019 FINDINGS: Tracheostomy tube in satisfactory position. Left-sided PICC line with the tip projecting over the SVC. Hazy bilateral lower lobe airspace disease. No pleural effusion or pneumothorax. Stable cardiomediastinal silhouette. IMPRESSION: Bilateral hazy lower lobe airspace disease concerning for multilobar pneumonia. Electronically Signed   By: Elige KoHetal  Patel   On: 05/08/2019 11:49   Dg Chest Port 1 View  Result Date: 05/07/2019 CLINICAL DATA:  Hypoxia. EXAM: PORTABLE CHEST 1 VIEW COMPARISON:  Apr 29, 2019 FINDINGS: A left PICC line terminates in good position. No pneumothorax. Small effusions, left greater than right, stable on the right and mildly increased on the left. Mild opacity in left base is likely atelectasis. Infiltrate less likely. No change in the cardiomediastinal silhouette. The tracheostomy tube projects over the  tracheal air column. IMPRESSION: 1. Small bilateral effusions, left greater than right, stable on the right and slightly increased on the left in the interval. Mild increased opacity in left base in the interval is favored represent atelectasis rather than developing infiltrate. Electronically Signed   By: Gerome Samavid  Williams III M.D   On: 05/07/2019 08:52    Procedures Procedures (including critical care time)  Medications Ordered in ED Medications  vancomycin (VANCOCIN) 1,500 mg in sodium chloride 0.9 % 500 mL IVPB (1,500 mg Intravenous New Bag/Given 05/08/19 1316)  vancomycin (VANCOCIN) 500 mg in sodium chloride 0.9 % 100 mL IVPB (has no administration in time range)  vancomycin (VANCOCIN) 1,750 mg in sodium chloride 0.9 % 500 mL IVPB (has no administration in time range)  piperacillin-tazobactam (ZOSYN) IVPB 3.375 g (has no administration in time range)  piperacillin-tazobactam (ZOSYN) IVPB 3.375 g (0 g Intravenous Stopped 05/08/19 1350)  sodium chloride 0.9 % bolus 1,000 mL (1,000 mLs Intravenous New Bag/Given 05/08/19 1351)     Initial Impression / Assessment and Plan / ED Course  I have reviewed the triage vital signs and the nursing notes.  Pertinent labs & imaging results that were available during my care of the patient were reviewed by me and considered in my medical decision making (see chart for details).        63 yo M sent here for hypoxia while at [facility].  Here the patient is setting into the upper 90s with very minimal vent settings.  Will obtain a chest x-ray lab work.  I wonder if the patient had a plug that somehow got released.  His last visit he had significant suctioning prior to improvement.  I was finally able to discuss the patient care with the representative from [facility].  They stated that they were having more trouble with his blood pressure this morning that it was not improving with peripheral vasopressors.  Patient has not been on vasopressors here and is  maintained maps greater than 65.  I discussed the case with the ICU physician who felt that the patient was likely safe to be admitted  by the hospitalist.  Recommended testing again for COVID though their last test was 9 days ago and negative.  COVID negative, UA with possible UTI is currently on cipro at [facility].   Will discuss with medicine.   CRITICAL CARE Performed by: Rae Roam   Total critical care time: 35 minutes  Critical care time was exclusive of separately billable procedures and treating other patients.  Critical care was necessary to treat or prevent imminent or life-threatening deterioration.  Critical care was time spent personally by me on the following activities: development of treatment plan with patient and/or surrogate as well as nursing, discussions with consultants, evaluation of patient's response to treatment, examination of patient, obtaining history from patient or surrogate, ordering and performing treatments and interventions, ordering and review of laboratory studies, ordering and review of radiographic studies, pulse oximetry and re-evaluation of patient's condition.  The patients results and plan were reviewed and discussed.   Any x-rays performed were independently reviewed by myself.   Differential diagnosis were considered with the presenting HPI.  Medications  vancomycin (VANCOCIN) 1,500 mg in sodium chloride 0.9 % 500 mL IVPB (1,500 mg Intravenous New Bag/Given 05/08/19 1316)  vancomycin (VANCOCIN) 500 mg in sodium chloride 0.9 % 100 mL IVPB (has no administration in time range)  vancomycin (VANCOCIN) 1,750 mg in sodium chloride 0.9 % 500 mL IVPB (has no administration in time range)  piperacillin-tazobactam (ZOSYN) IVPB 3.375 g (has no administration in time range)  piperacillin-tazobactam (ZOSYN) IVPB 3.375 g (0 g Intravenous Stopped 05/08/19 1350)  sodium chloride 0.9 % bolus 1,000 mL (1,000 mLs Intravenous New Bag/Given 05/08/19 1351)     Vitals:   05/08/19 1301 05/08/19 1340 05/08/19 1400 05/08/19 1440  BP: 107/76 (!) 99/52 110/72 91/75  Pulse: (!) 102 97 94 92  Resp: Temp:      TempSrc:      SpO2: 100% 100% 100% 100%    Final diagnoses:  HCAP (healthcare-associated pneumonia)    Admission/ observation were discussed with the admitting physician, patient and/or family and they are comfortable with the plan.   Final Clinical Impressions(s) / ED Diagnoses   Final diagnoses:  HCAP (healthcare-associated pneumonia)    ED Discharge Orders    None       Melene Plan, DO 05/08/19 1509    Melene Plan, DO 05/08/19 1522

## 2019-05-08 NOTE — ED Notes (Signed)
ED TO INPATIENT HANDOFF REPORT  ED Nurse Name and Phone #: [phone]   S Name/Age/Gender Francis Osborne 63 y.o. male Room/Bed: RESUSC/RESUSC  Code Status   Code Status: Full Code  Home/SNF/Other Skilled nursing facility Patient oriented to: self Is this baseline? Yes   Triage Complete: Triage complete  Chief Complaint resp failure  Triage Note Pt [facility] from [facility] after having respiratory distress with "mucus plug" this am. Pt was suctioned and saturations with ems was 100%. Pt did have low BP with ems 90's. Pt given 500CC of NS and has 500 currently infusing by [facility]. Pt is alert and resting comfortably. Pt has trach on vent. Pt is able to mouth words and is paraplegic.     Allergies Allergies  Allergen Reactions  . Contrast Media [Iodinated Diagnostic Agents]   . Iodine     Level of Care/Admitting Diagnosis ED Disposition    ED Disposition Condition Comment   Admit  [facility] [[id number]]  Level of Care: ICU [6]  Covid Evaluation: Screening Protocol (No Symptoms)  Diagnosis: Multifocal pneumonia [[id number]][[id number]]  Admitting Physician: Jonah BlueYATES, JENNIFER [[id number]]  Attending Physician: Jonah BlueYATES, JENNIFER [[id number]]  Estimated length of stay: 3 - 4 days  Certification:: I certify this patient will need inpatient services for at least 2 midnights  PT Class (Do Not Modify): Inpatient [101]  PT Acc Code (Do Not Modify): Private [1]       B Medical/Surgery History Past Medical History:  Diagnosis Date  . Anemia   . Pneumonia   . Quadriplegia (HCC)   . Respiratory failure [facility](HCC)    Past Surgical History:  Procedure Laterality Date  . TRACHEOSTOMY       A IV Location/Drains/Wounds Patient Lines/Drains/Airways Status   Active Line/Drains/Airways    Name:   Placement date:   Placement time:   Site:   Days:   Peripheral IV 05/08/19 Left Forearm   05/08/19    1315    Forearm   less than 1   PICC Double Lumen  04/29/19   04/29/19    -     9   Gastrostomy/Enterostomy   -    -    -      Suprapubic Catheter   -    -    -      Tracheostomy Portex 8 mm Cuffed   -    -    8 mm             Intake/Output Last 24 hours  Intake/Output Summary (Last 24 hours) at 05/08/2019 1722 Last data filed at 05/08/2019 1350 Gross per 24 hour  Intake 100 ml  Output -  Net 100 ml    Labs/Imaging Results for orders placed or performed during the hospital encounter of 05/08/19 (from the past 48 hour(s))  CBC with Differential     Status: Abnormal   Collection Time: 05/08/19 11:25 AM  Result Value Ref Range   WBC 22.6 (H) 4.0 - 10.5 K/uL   RBC 2.85 (L) 4.22 - 5.81 MIL/uL   Hemoglobin 8.7 (L) 13.0 - 17.0 g/dL   HCT 19.127.8 (L) 47.839.0 - 29.552.0 %   MCV 97.5 80.0 - 100.0 fL   MCH 30.5 26.0 - 34.0 pg   MCHC 31.3 30.0 - 36.0 g/dL   RDW 62.116.3 (H) 30.811.5 - 65.715.5 %   Platelets 174 150 - 400 K/uL   nRBC 0.0 0.0 - 0.2 %   Neutrophils Relative %  83 %   Neutro Abs 18.8 (H) 1.7 - 7.7 K/uL   Lymphocytes Relative 9 %   Lymphs Abs 1.9 0.7 - 4.0 K/uL   Monocytes Relative 7 %   Monocytes Absolute 1.6 (H) 0.1 - 1.0 K/uL   Eosinophils Relative 0 %   Eosinophils Absolute 0.1 0.0 - 0.5 K/uL   Basophils Relative 0 %   Basophils Absolute 0.0 0.0 - 0.1 K/uL   Immature Granulocytes 1 %   Abs Immature Granulocytes 0.22 (H) 0.00 - 0.07 K/uL    Comment: Performed at [facility], [address], [place], [place] [address]  Basic metabolic panel     Status: Abnormal   Collection Time: 05/08/19 11:25 AM  Result Value Ref Range   Sodium 145 135 - 145 mmol/L   Potassium 4.8 3.5 - 5.1 mmol/L   Chloride 108 98 - 111 mmol/L   CO2 24 22 - 32 mmol/L   Glucose, Bld 117 (H) 70 - 99 mg/dL   BUN 81 (H) 8 - 23 mg/dL   Creatinine, Ser 6.04 0.61 - 1.24 mg/dL   Calcium 9.1 8.9 - 54.0 mg/dL   GFR calc non Af Amer >60 >60 mL/min   GFR calc Af Amer >60 >60 mL/min   Anion gap 13 5 - 15    Comment: Performed at [facility], [address] [place], [place] [address]  SARS Coronavirus 2 (CEPHEID- Performed in [facility] hospital lab), Hosp Order     Status: None   Collection Time: 05/08/19  1:52 PM  Result Value Ref Range   SARS Coronavirus 2 NEGATIVE NEGATIVE    Comment: (NOTE) If result is NEGATIVE SARS-CoV-2 target nucleic acids are NOT DETECTED. The SARS-CoV-2 RNA is generally detectable in upper and lower  respiratory specimens during the acute phase of infection. The lowest  concentration of SARS-CoV-2 viral copies this assay can detect is 250  copies / mL. A negative result does not preclude SARS-CoV-2 infection  and should not be used as the sole basis for treatment or other  patient management decisions.  A negative result may occur with  improper specimen collection / handling, submission of specimen other  than nasopharyngeal swab, presence of viral mutation(s) within the  areas targeted by this assay, and inadequate number of viral copies  (<250 copies / mL). A negative result must be combined with clinical  observations, patient history, and epidemiological information. If result is POSITIVE SARS-CoV-2 target nucleic acids are DETECTED. The SARS-CoV-2 RNA is generally detectable in upper and lower  respiratory specimens dur ing the acute phase of infection.  Positive  results are indicative of active infection with SARS-CoV-2.  Clinical  correlation with patient history and other diagnostic information is  necessary to determine patient infection status.  Positive results do  not rule out bacterial infection or co-infection with other viruses. If result is PRESUMPTIVE POSTIVE SARS-CoV-2 nucleic acids MAY BE PRESENT.   A presumptive positive result was obtained on the submitted specimen  and confirmed on repeat testing.  While 2019 novel coronavirus  (SARS-CoV-2) nucleic acids may be present in the submitted sample  additional confirmatory testing may be necessary for epidemiological  and / or clinical  management purposes  to differentiate between  SARS-CoV-2 and other Sarbecovirus currently known to infect humans.  If clinically indicated additional testing with an alternate test  methodology [phone]) is advised. The SARS-CoV-2 RNA is generally  detectable in upper and lower respiratory sp ecimens during  the acute  phase of infection. The expected result is Negative. Fact Sheet for Patients:  BoilerBrush.com.cy Fact Sheet for Healthcare Providers: https://pope.com/ This test is not yet approved or cleared by the [place] FDA and has been authorized for detection and/or diagnosis of SARS-CoV-2 by FDA under an Emergency Use Authorization (EUA).  This EUA will remain in effect (meaning this test can be used) for the duration of the COVID-19 declaration under Section 564(b)(1) of the Act, 21 U.S.C. section 360bbb-3(b)(1), unless the authorization is terminated or revoked sooner. Performed at [facility], [address] [place], [place] [address]   Lactic acid, plasma     Status: None   Collection Time: 05/08/19  2:41 PM  Result Value Ref Range   Lactic Acid, Venous 1.3 0.5 - 1.9 mmol/L    Comment: Performed at [facility], [address] [place], [place] [address]  Urinalysis, Routine w reflex microscopic     Status: Abnormal   Collection Time: 05/08/19  2:41 PM  Result Value Ref Range   Color, Urine YELLOW YELLOW   APPearance HAZY (A) CLEAR   Specific Gravity, Urine 1.016 1.005 - 1.030   pH 8.0 5.0 - 8.0   Glucose, UA NEGATIVE NEGATIVE mg/dL   Hgb urine dipstick NEGATIVE NEGATIVE   Bilirubin Urine NEGATIVE NEGATIVE   Ketones, ur NEGATIVE NEGATIVE mg/dL   Protein, ur 30 (A) NEGATIVE mg/dL   Nitrite NEGATIVE NEGATIVE   Leukocytes,Ua LARGE (A) NEGATIVE   RBC / HPF 0-5 0 - 5 RBC/hpf   WBC, UA 21-50 0 - 5 WBC/hpf   Bacteria, UA FEW (A) NONE SEEN   Squamous Epithelial / LPF 0-5 0 - 5   Mucus PRESENT    Budding  Yeast PRESENT     Comment: Performed at [facility], [address] [place], [place] [address]  I-STAT 7, (LYTES, BLD GAS, ICA, H+H)     Status: Abnormal   Collection Time: 05/08/19  2:50 PM  Result Value Ref Range   pH, Arterial 7.528 (H) 7.350 - 7.450   pCO2 arterial 29.8 (L) 32.0 - 48.0 mmHg   pO2, Arterial 173.0 (H) 83.0 - 108.0 mmHg   Bicarbonate 24.7 20.0 - 28.0 mmol/L   TCO2 26 22 - 32 mmol/L   O2 Saturation 100.0 %   Acid-Base Excess 2.0 0.0 - 2.0 mmol/L   Sodium 146 (H) 135 - 145 mmol/L   Potassium 4.1 3.5 - 5.1 mmol/L   Calcium, Ion 1.18 1.15 - 1.40 mmol/L   HCT 22.0 (L) 39.0 - 52.0 %   Hemoglobin 7.5 (L) 13.0 - 17.0 g/dL   Patient temperature 91.4 F    Collection site RADIAL, ALLEN'S TEST ACCEPTABLE    Drawn by RT    Sample type ARTERIAL    Dg Chest Port 1 View  Result Date: 05/08/2019 CLINICAL DATA:  Shortness of breath EXAM: PORTABLE CHEST 1 VIEW COMPARISON:  05/07/2019 FINDINGS: Tracheostomy tube in satisfactory position. Left-sided PICC line with the tip projecting over the SVC. Hazy bilateral lower lobe airspace disease. No pleural effusion or pneumothorax. Stable cardiomediastinal silhouette. IMPRESSION: Bilateral hazy lower lobe airspace disease concerning for multilobar pneumonia. Electronically Signed   By: Elige Ko   On: 05/08/2019 11:49   Dg Chest Port 1 View  Result Date: 05/07/2019 CLINICAL DATA:  Hypoxia. EXAM: PORTABLE CHEST 1 VIEW COMPARISON:  Apr 29, 2019 FINDINGS: A left PICC line terminates in good position. No pneumothorax. Small effusions, left greater than  right, stable on the right and mildly increased on the left. Mild opacity in left base is likely atelectasis. Infiltrate less likely. No change in the cardiomediastinal silhouette. The tracheostomy tube projects over the tracheal air column. IMPRESSION: 1. Small bilateral effusions, left greater than right, stable on the right and slightly increased on the left in the interval. Mild increased  opacity in left base in the interval is favored represent atelectasis rather than developing infiltrate. Electronically Signed   By: Gerome [name] III M.D   On: 05/07/2019 08:52    Pending Labs Unresulted Labs (From admission, onward)    Start     Ordered   05/09/19 0500  Basic metabolic panel  Tomorrow morning,   R     05/08/19 1648   05/09/19 0500  CBC WITH DIFFERENTIAL  Tomorrow morning,   R     05/08/19 1648   05/08/19 1649  Legionella Pneumophila Serogp 1 Ur Ag  Once,   R     05/08/19 1648   05/08/19 1647  Culture, sputum-assessment  Once,   R    Question:  Patient immune status  Answer:  Normal   05/08/19 1648   05/08/19 1647  Gram stain  Once,   R    Question:  Patient immune status  Answer:  Normal   05/08/19 1648   05/08/19 1647  HIV antibody (Routine Screening)  Once,   R     05/08/19 1648   05/08/19 1647  Strep pneumoniae urinary antigen  Once,   R     05/08/19 1648   05/08/19 1415  Culture, respiratory (non-expectorated)  Once,   R     05/08/19 1414   05/08/19 1324  Urine culture  ONCE - STAT,   STAT     05/08/19 1323   05/08/19 1323  Blood culture (routine x 2)  BLOOD CULTURE X 2,   STAT     05/08/19 1323          Vitals/Pain Today's Vitals   05/08/19 1400 05/08/19 1440 05/08/19 1553 05/08/19 1645  BP: 110/72 91/75    Pulse: 94 92 92 88  Resp: 19 19 (!) 21 20  Temp:      TempSrc:      SpO2: 100% 100% 100% 100%  PainSc:        Isolation Precautions No active isolations  Medications Medications  vancomycin (VANCOCIN) 500 mg in sodium chloride 0.9 % 100 mL IVPB (500 mg Intravenous New Bag/Given 05/08/19 1651)  vancomycin (VANCOCIN) 1,750 mg in sodium chloride 0.9 % 500 mL IVPB (has no administration in time range)  piperacillin-tazobactam (ZOSYN) IVPB 3.375 g (has no administration in time range)  acetaminophen (TYLENOL) tablet 650 mg (has no administration in time range)  ibuprofen (ADVIL) 100 MG/5ML suspension 200 mg (has no administration in time  range)  oxyCODONE-acetaminophen (PERCOCET/ROXICET) 5-325 MG per tablet 1 tablet (has no administration in time range)  midodrine (PROAMATINE) tablet 2.5 mg (has no administration in time range)  fludrocortisone (FLORINEF) tablet 0.1 mg (has no administration in time range)  glycopyrrolate (ROBINUL) tablet 1 mg (has no administration in time range)  magnesium oxide (MAG-OX) tablet 400 mg (has no administration in time range)  scopolamine (TRANSDERM-SCOP) 1 MG/3DAYS 1.5 mg (has no administration in time range)  sodium bicarbonate tablet 650 mg (has no administration in time range)  Baclofen TABS 5 mg (has no administration in time range)  clonazePAM (KLONOPIN) tablet 0.5 mg (has no administration in time range)  feeding  supplement (PRO-STAT SUGAR FREE 64) liquid 30 mL (has no administration in time range)  vitamin C (ASCORBIC ACID) tablet 500 mg (has no administration in time range)  chlorhexidine (PERIDEX) 0.12 % solution 15 mL (has no administration in time range)  enoxaparin (LOVENOX) injection 40 mg (has no administration in time range)  0.9 %  sodium chloride infusion ( Intravenous New Bag/Given 05/08/19 1654)  vancomycin (VANCOCIN) 1,500 mg in sodium chloride 0.9 % 500 mL IVPB (0 mg Intravenous Stopped 05/08/19 1708)  piperacillin-tazobactam (ZOSYN) IVPB 3.375 g (0 g Intravenous Stopped 05/08/19 1350)  sodium chloride 0.9 % bolus 1,000 mL (1,000 mLs Intravenous New Bag/Given 05/08/19 1351)    Mobility Bedbound/Trach  Low fall risk   Focused Assessments Pulmonary Assessment Handoff:  Lung sounds: Bilateral Breath Sounds: Diminished O2 Device: Ventilator        R Recommendations: See Admitting Provider Note  Report given to:   Additional Notes: From [facility]

## 2019-05-08 NOTE — ED Notes (Signed)
ED Provider at bedside. 

## 2019-05-08 NOTE — ED Notes (Signed)
MD Adela Lank made aware that pt has blood cultures in lab currently that were drew less than 24 hours ago in ED. MD states okay to hold off due to pt currently receiving antibiotics.

## 2019-05-08 NOTE — Progress Notes (Addendum)
Pharmacy Antibiotic Note  Francis Osborne is a 63 y.o. male admitted on 05/08/2019 with multilobar PNA.  Pharmacy has been consulted for vancomycin/Zosyn dosing. Patient was previously on Vancomycin 4/26 > 5/1 at [facility]. Recently was on 7 day course of ciprofloxacin (for UTI?), last dose 5/16. CT reveals multilobar PNA. Patient is afebrile, WBC 22.6, LA (yesterday) 1.7. Scr 1.18 (baseline 0.5-0.7).   Vancomycin 1750 mg IV Q 24 hrs. Goal AUC 400-550. Expected AUC: 468 SCr used: 1.18  Plan: Received vancomycin 1500 mg IV x1 in the ED, will give another 500 mg to complete bolus. Then start 1750 mg IV q24h Zosyn 3.375g IV q8h (4 hour infusion). Monitor clinical status, renal function, cultures, and length of therapy Monitor vancomycin levels as needed   Temp (24hrs), Avg:99.3 F (37.4 C), Min:99.3 F (37.4 C), Max:99.3 F (37.4 C)  Recent Labs  Lab 05/07/19 0901 05/08/19 1125  WBC 17.2* 22.6*  CREATININE 0.71 1.18  LATICACIDVEN 1.6  --     Estimated Creatinine Clearance: 71.2 mL/min (by C-G formula based on SCr of 1.18 mg/dL).    Allergies  Allergen Reactions  . Contrast Media [Iodinated Diagnostic Agents]   . Iodine     Antimicrobials this admission: Vancomycin 5/18 >> Zosyn 5/18 >>  Dose adjustments this admission:   Microbiology results: 5/17 BCx: 5/17 UCx:  Thank you for allowing pharmacy to be a part of this patient's care.   Marcelino Freestone, PharmD PGY2 Cardiology Pharmacy Resident Please check AMION for all Pharmacist numbers by unit 05/08/2019 2:00 PM

## 2019-05-08 NOTE — Progress Notes (Signed)
eLink Physician-Brief Progress Note Patient Name: Francis Osborne DOB: 12-May-1956 MRN: [id number]   Date of Service  05/08/2019  HPI/Events of Note  Hypotension - BP = 82/58 with MAP = 62. Patient takes Midodrine and Florinef at home.   eICU Interventions  Will order: 1. Please be sure that patient received his Midodrine and Florinef dose today. 2. Bolus with 0.9 NaCl 500 mL IV over 30 minutes now.      Intervention Category Major Interventions: Hypotension - evaluation and management  Sequoyah Counterman Eugene 05/08/2019, 10:12 PM

## 2019-05-08 NOTE — ED Notes (Signed)
Pt rolled onto left side to be off of wound. Pt has elbow pads and foot pads, as well as pillow under both knees.

## 2019-05-08 NOTE — ED Triage Notes (Signed)
Pt arrives via [facility] from [facility] after having respiratory distress with "mucus plug" this am. Pt was suctioned and saturations with ems was 100%. Pt did have low BP with ems 90's. Pt given 500CC of NS and has 500 currently infusing by care link. Pt is alert and resting comfortably. Pt has trach on vent. Pt is able to mouth words and is paraplegic.
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
## 2019-05-09 ENCOUNTER — Telehealth: Payer: Self-pay | Admitting: Emergency Medicine

## 2019-05-09 LAB — IRON AND TIBC
Iron: 18 ug/dL — ABNORMAL LOW (ref 45–182)
Saturation Ratios: 7 % — ABNORMAL LOW (ref 17.9–39.5)
TIBC: 262 ug/dL (ref 250–450)
UIBC: 244 ug/dL

## 2019-05-09 LAB — CBC WITH DIFFERENTIAL/PLATELET
Abs Immature Granulocytes: 0.09 10*3/uL — ABNORMAL HIGH (ref 0.00–0.07)
Basophils Absolute: 0 10*3/uL (ref 0.0–0.1)
Basophils Relative: 0 %
Eosinophils Absolute: 0.3 10*3/uL (ref 0.0–0.5)
Eosinophils Relative: 2 %
HCT: 22.8 % — ABNORMAL LOW (ref 39.0–52.0)
Hemoglobin: 7.1 g/dL — ABNORMAL LOW (ref 13.0–17.0)
Immature Granulocytes: 1 %
Lymphocytes Relative: 11 %
Lymphs Abs: 1.6 10*3/uL (ref 0.7–4.0)
MCH: 30.6 pg (ref 26.0–34.0)
MCHC: 31.1 g/dL (ref 30.0–36.0)
MCV: 98.3 fL (ref 80.0–100.0)
Monocytes Absolute: 0.8 10*3/uL (ref 0.1–1.0)
Monocytes Relative: 5 %
Neutro Abs: 11.8 10*3/uL — ABNORMAL HIGH (ref 1.7–7.7)
Neutrophils Relative %: 81 %
Platelets: 146 10*3/uL — ABNORMAL LOW (ref 150–400)
RBC: 2.32 MIL/uL — ABNORMAL LOW (ref 4.22–5.81)
RDW: 16.4 % — ABNORMAL HIGH (ref 11.5–15.5)
WBC: 14.6 10*3/uL — ABNORMAL HIGH (ref 4.0–10.5)
nRBC: 0 % (ref 0.0–0.2)

## 2019-05-09 LAB — BASIC METABOLIC PANEL
Anion gap: 11 (ref 5–15)
BUN: 70 mg/dL — ABNORMAL HIGH (ref 8–23)
CO2: 22 mmol/L (ref 22–32)
Calcium: 8.3 mg/dL — ABNORMAL LOW (ref 8.9–10.3)
Chloride: 114 mmol/L — ABNORMAL HIGH (ref 98–111)
Creatinine, Ser: 1.02 mg/dL (ref 0.61–1.24)
GFR calc Af Amer: >60 mL/min (ref >60)
GFR calc non Af Amer: >60 mL/min (ref >60)
Glucose, Bld: 94 mg/dL (ref 70–99)
Potassium: 3.7 mmol/L (ref 3.5–5.1)
Sodium: 147 mmol/L — ABNORMAL HIGH (ref 135–145)

## 2019-05-09 LAB — MRSA PCR SCREENING: MRSA by PCR: POSITIVE — AB

## 2019-05-09 LAB — HIV ANTIBODY (ROUTINE TESTING W REFLEX): HIV Screen 4th Generation wRfx: NONREACTIVE

## 2019-05-09 LAB — GLUCOSE, CAPILLARY
Glucose-Capillary: 103 mg/dL — ABNORMAL HIGH (ref 70–99)
Glucose-Capillary: 111 mg/dL — ABNORMAL HIGH (ref 70–99)

## 2019-05-09 LAB — RETICULOCYTES
Immature Retic Fract: 18.3 % — ABNORMAL HIGH (ref 2.3–15.9)
RBC.: 2.39 MIL/uL — ABNORMAL LOW (ref 4.22–5.81)
Retic Count, Absolute: 29.6 10*3/uL (ref 19.0–186.0)
Retic Ct Pct: 1.2 % (ref 0.4–3.1)

## 2019-05-09 LAB — FERRITIN: Ferritin: 1051 ng/mL — ABNORMAL HIGH (ref 24–336)

## 2019-05-09 LAB — VITAMIN B12: Vitamin B-12: 886 pg/mL (ref 180–914)

## 2019-05-09 LAB — FOLATE: Folate: 17.9 ng/mL (ref >5.9)

## 2019-05-09 MED ORDER — JUVEN PO PACK
1.0000 | PACK | Freq: Two times a day (BID) | ORAL | Status: DC
  Administered 2019-05-09 – 2019-05-16 (×16): 1
  Filled 2019-05-09 (×16): qty 1

## 2019-05-09 MED ORDER — VANCOMYCIN HCL IN DEXTROSE 750-5 MG/150ML-% IV SOLN
750.0000 mg | Freq: Two times a day (BID) | INTRAVENOUS | Status: DC
  Administered 2019-05-09 – 2019-05-10 (×2): 750 mg via INTRAVENOUS
  Filled 2019-05-09 (×3): qty 150

## 2019-05-09 MED ORDER — MUPIROCIN 2 % EX OINT
1.0000 "application " | TOPICAL_OINTMENT | Freq: Two times a day (BID) | CUTANEOUS | Status: AC
  Administered 2019-05-09 – 2019-05-13 (×10): 1 via NASAL
  Filled 2019-05-09 (×2): qty 22

## 2019-05-09 MED ORDER — SODIUM CHLORIDE 0.9% FLUSH: 10.0000 mL | INTRAVENOUS | Status: DC | PRN

## 2019-05-09 MED ORDER — SODIUM CHLORIDE 0.45 % IV SOLN
INTRAVENOUS | Status: DC
  Administered 2019-05-09 – 2019-05-12 (×4): via INTRAVENOUS

## 2019-05-09 MED ORDER — POLYVINYL ALCOHOL 1.4 % OP SOLN
1.0000 [drp] | OPHTHALMIC | Status: DC | PRN
  Filled 2019-05-09: qty 15

## 2019-05-09 MED ORDER — CHLORHEXIDINE GLUCONATE CLOTH 2 % EX PADS
6.0000 | MEDICATED_PAD | Freq: Every day | CUTANEOUS | Status: DC
  Administered 2019-05-09 – 2019-05-11 (×3): 6 via TOPICAL

## 2019-05-09 MED ORDER — CHLORHEXIDINE GLUCONATE CLOTH 2 % EX PADS
6.0000 | MEDICATED_PAD | Freq: Every day | CUTANEOUS | Status: DC
  Administered 2019-05-09 – 2019-05-16 (×8): 6 via TOPICAL

## 2019-05-09 MED ORDER — SODIUM CHLORIDE 0.9% FLUSH
10.0000 mL | Freq: Two times a day (BID) | INTRAVENOUS | Status: DC
  Administered 2019-05-09 – 2019-05-16 (×13): 10 mL

## 2019-05-09 MED ORDER — JEVITY 1.2 CAL PO LIQD
1000.0000 mL | ORAL | Status: DC
  Administered 2019-05-09: 30 mL
  Administered 2019-05-10: 60 mL/h
  Administered 2019-05-11 – 2019-05-12 (×2): 1000 mL
  Administered 2019-05-13: 07:00:00
  Administered 2019-05-14 – 2019-05-15 (×3): 1000 mL
  Filled 2019-05-09 (×12): qty 1000

## 2019-05-09 NOTE — Consult Note (Addendum)
WOC Nurse wound consult note Patient receiving care in [facility] 2M04.  At the current time, the FYIS box contains the following statement:  "POSSIBLE NOVEL CORONAVIRUS (2019-NCOV)". Reason for Consult: Pressure injury and suprapubic catheter Wound type: unknown at this time. I have placed a request via Secure Chat to Dr. Basil Dess  requesting photos of the wound areas of concern to be placed in the EMR.  The WOC nurses are working remotely today and photos facilitate accuracy in providing wound treatment guidance.  Thank you for the consult.  Helmut Muster, RN, MSN, CWOCN, CNS-BC, pager [phone]

## 2019-05-09 NOTE — Telephone Encounter (Signed)
Post ED Visit - Positive Culture Follow-up  Culture report reviewed by antimicrobial stewardship pharmacist: [facility] []  Enzo Bi, Pharm.D. []  Celedonio Miyamoto, Pharm.D., BCPS AQ-ID []  Garvin Fila, Pharm.D., BCPS []  Georgina Pillion, Pharm.D., BCPS []  [place], [address].D., BCPS, AAHIVP []  Estella Husk, Pharm.D., BCPS, AAHIVP [x]  Lysle Pearl, PharmD, BCPS []  Phillips Climes, PharmD, BCPS []  Agapito Games, PharmD, BCPS []  Verlan Friends, PharmD []  Mervyn Gay, PharmD, BCPS []  Vinnie Level, PharmD  [facility] []  Len Childs, PharmD []  Greer Pickerel, PharmD []  Adalberto Cole, PharmD []  Perlie Gold, Rph []  Lonell Face) Jean Rosenthal, PharmD []  Earl Many, PharmD []  Junita Push, PharmD []  Dorna Leitz, PharmD []  Terrilee Files, PharmD []  Lynann Beaver, PharmD []  Keturah Barre, PharmD []  Loralee Pacas, PharmD []  Bernadene Person, PharmD   Positive urine culture Treated with none, contaminant, no further patient follow-up is required at this time.  Berle Mull 05/09/2019, 10:36 AM
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
## 2019-05-09 NOTE — Consult Note (Signed)
WOC Nurse wound consult note Patient receiving care in [facility] 2M04.  I spoke with his primary RN, Denyse Amass, via telephone and explained I am hopeful that Dr. Mitchel Honour will place photos in the EMR for the [facility] consult areas of concern.  At this time photos are not present.  Denyse Amass could not discuss it with me at this time.  She will call me or page me when she has availability.  Helmut Muster, RN, MSN, CWOCN, CNS-BC, pager [phone]

## 2019-05-09 NOTE — Consult Note (Signed)
WOC Nurse wound consult note Patient receiving care in [facility] 2M04.  At the current time, the FYIS box contains the following statement:  "POSSIBLE NOVEL CORONAVIRUS (2019-NCOV)". Reason for Consult: Pressure injury and suprapubic catheter Wound type: unknown at this time. I see the EMR now indicates that Dr. Mitchel Honour is the attending physician.  I have placed a request via Secure Chat to Dr. Mitchel Honour asking that photos be placed in the EMR to facilitate accuracy in providing wound treatment guidance.  Helmut Muster, RN, MSN, CWOCN, CNS-BC, pager [phone]

## 2019-05-09 NOTE — Progress Notes (Signed)
CSW received a call from pt's brother Casen Housman at [phone] who was returning the 1st shift ED CSW's call from 2pm.  2nd shift ED CSW will leave handoff for 1st shift CSW.  CSW will continue to follow for D/C needs.  Dorothe Pea. Arlyn Bumpus, LCSW, LCAS, CSI Transitions of Care Clinical Social Worker Care Coordination Department Ph: [phone]
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
## 2019-05-09 NOTE — Progress Notes (Signed)
CSW placed call to patient's brother Bailor Dargin at [phone], no answer so a voicemail was left requesting a return call.  Edwin Dada, MSW, LCSW-A Clinical Social Worker [facility] [phone]

## 2019-05-09 NOTE — Progress Notes (Signed)
Pharmacy Antibiotic Note  Francis Osborne is a 63 y.o. male admitted on 05/08/2019 with multilobar PNA. Pharmacy has been consulted for vancomycin/Zosyn dosing - day #2. Patient was previously on Vancomycin 4/26 > 5/1 at [facility]. Recently was on 7-day course of ciprofloxacin (for UTI?), last dose 5/16. CT reveals multilobar PNA.   Noted patient is quadriplegic with chronic trach/PEG from [facility]. Scr 0.71 on admit, bumped to 1.18 yesterday, now trend back down to 1.02 (baseline 0.5-0.7).  Plan: Change vancomycin to 750 mg IV q12h - d/c soon with GNR growing in TA? Zosyn 3.375g IV q8h (4 hour infusion). Monitor clinical status, renal function, cultures, and length of therapy Monitor vancomycin levels closely in quadriplegic patient   Temp (24hrs), Avg:98.5 F (36.9 C), Min:98.3 F (36.8 C), Max:98.9 F (37.2 C)  Recent Labs  Lab 05/07/19 0901 05/08/19 1125 05/08/19 1441 05/09/19 0350  WBC 17.2* 22.6*  --  14.6*  CREATININE 0.71 1.18  --  1.02  LATICACIDVEN 1.6  --  1.3  --     Estimated Creatinine Clearance: 82.4 mL/min (by C-G formula based on SCr of 1.02 mg/dL).    Allergies  Allergen Reactions  . Contrast Media [Iodinated Diagnostic Agents]   . Iodine     Antimicrobials this admission: Vancomycin 5/18 >> Zosyn 5/18 >>  Microbiology results: 5/17 BCx: ngtd 5/17 UCx: 30k col yeast 5/18 TA - few GNR 5/18 BCx - ngtd 5/18 covid - neg 5/18 mrsa pcr +  Babs Bertin, PharmD, BCPS Please check AMION for all [facility] contact numbers Clinical Pharmacist 05/09/2019 11:21 AM

## 2019-05-09 NOTE — Consult Note (Signed)
WOC Nurse wound consult note Patient receiving care in [facility] 2M04.  WOC consult completed with the assistance of the primary RN, Cori via telephone and guided questions. Reason for Consult: Pressure injury, suprapubic catheter Wound type: There is not a wound at the suprapubic catheter, only dry, crusty drainage.  For this area, it is normal skin hygiene with soap and water and a split gauze dressing daily that I will order.   Pressure Injury POA: Yes Measurement: The sacrum has a wound that measures 5 cm x 4 cm x minimal depth.  The wound bed is black and red; there is no odor, no drainage.  At the left perineal area where the upper thigh meets the perineum, there is a wound that measures 3 cm x 8 cm x 0.3 cm.  This wound is described as pink, red, and slightly bloody with a small darkened spot.  And, along the left hip there is a red and black wound that measures 3cm x 3cm without any measurable depth. Dressing procedure/placement/frequency: For all of the above described wounds, cleanse the areas with soap and water. Pat dry. Apply a hydrocolloid dressing [place] [id number]) to each area. Change daily. Monitor the wound area(s) for worsening of condition such as: Signs/symptoms of infection,  Increase in size,  Development of or worsening of odor, Development of pain, or increased pain at the affected locations.  Notify the medical team if any of these develop.  Thank you for the consult.  Discussed plan of care with the patient and bedside nurse.  WOC nurse will not follow at this time.  Please re-consult the WOC team if needed.  Helmut Muster, RN, MSN, CWOCN, CNS-BC, pager [phone]

## 2019-05-10 DIAGNOSIS — Z930 Tracheostomy status: Secondary | ICD-10-CM

## 2019-05-10 DIAGNOSIS — L89159 Pressure ulcer of sacral region, unspecified stage: Secondary | ICD-10-CM

## 2019-05-10 DIAGNOSIS — Z931 Gastrostomy status: Secondary | ICD-10-CM

## 2019-05-10 DIAGNOSIS — L8990 Pressure ulcer of unspecified site, unspecified stage: Secondary | ICD-10-CM

## 2019-05-10 DIAGNOSIS — G8250 Quadriplegia, unspecified: Secondary | ICD-10-CM

## 2019-05-10 DIAGNOSIS — E119 Type 2 diabetes mellitus without complications: Secondary | ICD-10-CM

## 2019-05-10 LAB — CBC
HCT: 22.8 % — ABNORMAL LOW (ref 39.0–52.0)
Hemoglobin: 6.8 g/dL — CL (ref 13.0–17.0)
MCH: 30.2 pg (ref 26.0–34.0)
MCHC: 29.8 g/dL — ABNORMAL LOW (ref 30.0–36.0)
MCV: 101.3 fL — ABNORMAL HIGH (ref 80.0–100.0)
Platelets: 145 10*3/uL — ABNORMAL LOW (ref 150–400)
RBC: 2.25 MIL/uL — ABNORMAL LOW (ref 4.22–5.81)
RDW: 16.2 % — ABNORMAL HIGH (ref 11.5–15.5)
WBC: 10.5 10*3/uL (ref 4.0–10.5)
nRBC: 0 % (ref 0.0–0.2)

## 2019-05-10 LAB — HEMOGLOBIN AND HEMATOCRIT, BLOOD
HCT: 27.1 % — ABNORMAL LOW (ref 39.0–52.0)
Hemoglobin: 8.3 g/dL — ABNORMAL LOW (ref 13.0–17.0)

## 2019-05-10 LAB — GLUCOSE, CAPILLARY
Glucose-Capillary: 100 mg/dL — ABNORMAL HIGH (ref 70–99)
Glucose-Capillary: 109 mg/dL — ABNORMAL HIGH (ref 70–99)
Glucose-Capillary: 117 mg/dL — ABNORMAL HIGH (ref 70–99)
Glucose-Capillary: 133 mg/dL — ABNORMAL HIGH (ref 70–99)
Glucose-Capillary: 134 mg/dL — ABNORMAL HIGH (ref 70–99)
Glucose-Capillary: 98 mg/dL (ref 70–99)

## 2019-05-10 LAB — C DIFFICILE QUICK SCREEN W PCR REFLEX
C Diff antigen: NEGATIVE
C Diff interpretation: NOT DETECTED
C Diff toxin: NEGATIVE

## 2019-05-10 LAB — BASIC METABOLIC PANEL
Anion gap: 13 (ref 5–15)
BUN: 75 mg/dL — ABNORMAL HIGH (ref 8–23)
CO2: 19 mmol/L — ABNORMAL LOW (ref 22–32)
Calcium: 8.5 mg/dL — ABNORMAL LOW (ref 8.9–10.3)
Chloride: 115 mmol/L — ABNORMAL HIGH (ref 98–111)
Creatinine, Ser: 0.75 mg/dL (ref 0.61–1.24)
GFR calc Af Amer: >60 mL/min (ref >60)
GFR calc non Af Amer: >60 mL/min (ref >60)
Glucose, Bld: 143 mg/dL — ABNORMAL HIGH (ref 70–99)
Potassium: 3.7 mmol/L (ref 3.5–5.1)
Sodium: 147 mmol/L — ABNORMAL HIGH (ref 135–145)

## 2019-05-10 LAB — ABO/RH: ABO/RH(D): A POS

## 2019-05-10 LAB — PREPARE RBC (CROSSMATCH)

## 2019-05-10 MED ORDER — SODIUM CHLORIDE 0.9% IV SOLUTION: Freq: Once | INTRAVENOUS | Status: DC

## 2019-05-10 MED ORDER — FREE WATER
200.0000 mL | Freq: Three times a day (TID) | Status: DC
  Administered 2019-05-10 – 2019-05-16 (×19): 200 mL

## 2019-05-10 MED ORDER — PANTOPRAZOLE SODIUM 40 MG PO PACK
40.0000 mg | PACK | Freq: Every day | ORAL | Status: DC
  Administered 2019-05-10 – 2019-05-16 (×7): 40 mg
  Filled 2019-05-10 (×7): qty 20

## 2019-05-10 NOTE — Progress Notes (Signed)
Patient had three bowel movements today, liquid in consistency, sacral dressing changed each time, provider notified, he will place order.
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
## 2019-05-10 NOTE — Progress Notes (Signed)
Two phone updates with sister today.
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
## 2019-05-10 NOTE — Progress Notes (Addendum)
CRITICAL VALUE ALERT  Critical Value: Hemoglobin 6.8  Date & Time Notied:  0526  05/10/2019  Provider Notified: Bruna Potter, MD  Orders Received/Actions taken: Awaiting orders

## 2019-05-10 NOTE — NC FL2 (Addendum)
[place] LEVEL OF CARE SCREENING TOOL     IDENTIFICATION  Patient Name: Francis Osborne Birthdate: 07/30/1956 Sex: male Admission Date (Current Location): 05/08/2019  [place] and [place] Number:  Kathleen Argue [id number] [place] and Address:  [facility] [place] [place] [address], [place], [place] [address]      Provider Number: [id number]  Attending Physician Name and Address:  Hosie Poisson, MD  Relative Name and Phone Number:  Br[place], Navis, [phone]    Current Level of Care: Hospital Recommended Level of Care: [place] Prior Approval Number:    Date Approved/Denied:   PASRR Number:    Discharge Plan: SNF    Current Diagnoses: Patient Active Problem List   Diagnosis Date Noted  . Pressure injury of skin 05/10/2019  . Multifocal pneumonia 05/08/2019  . Tracheostomy tube present ([place]) 05/08/2019  . Quadriplegia ([place]) 05/08/2019  . S/P percutaneous endoscopic gastrostomy (PEG) tube placement ([place]) 05/08/2019  . Type 2 diabetes mellitus without complication ([place]) 43/15/4008    Orientation RESPIRATION BLADDER Height & Weight     Self, Time, Situation, Place  Vent(Weaning will be neccesary ) Incontinent, Indwelling catheter Weight: 174 lb 2.6 oz (79 kg) Height:     BEHAVIORAL SYMPTOMS/MOOD NEUROLOGICAL BOWEL NUTRITION STATUS  Dangerous to self, others or property Convulsions/Seizures Incontinent Feeding tube  AMBULATORY STATUS COMMUNICATION OF NEEDS Skin   Total Care Non-Verbally Other (Comment)(Wound care)                       Personal Care Assistance Level of Assistance  Dressing, Feeding, Bathing Bathing Assistance: Maximum assistance Feeding assistance: Maximum assistance Dressing Assistance: Maximum assistance Total Care Assistance: Limited assistance   Functional Limitations Info  Speech     Speech Info: Impaired    SPECIAL CARE FACTORS FREQUENCY  Speech therapy             Speech  Therapy Frequency: Requires follow-up to determine course      Contractures Contractures Info: Not present    Additional Factors Info  Code Status, Allergies(Full) Code Status Info: Full Allergies Info: Iodine, contrast dyes            Current Medications (05/10/2019):  This is the current hospital active medication list Current Facility-Administered Medications  Medication Dose Route Frequency Provider Last Rate Last Dose  . 0.45 % sodium chloride infusion   Intravenous Continuous Domenic Polite, MD 75 mL/hr at 05/10/19 1800    . 0.9 %  sodium chloride infusion (Manually program via Guardrails IV Fluids)   Intravenous Once Lovey Newcomer T, NP      . acetaminophen (TYLENOL) tablet 650 mg  650 mg Per Tube Q4H PRN Karmen Bongo, MD   650 mg at 05/10/19 1745  . baclofen (LIORESAL) tablet 5 mg  5 mg Per Tube TID Karmen Bongo, MD   5 mg at 05/10/19 1543  . chlorhexidine (PERIDEX) 0.12 % solution 15 mL  15 mL Mouth/Throat BID Karmen Bongo, MD   15 mL at 05/10/19 0856  . chlorhexidine gluconate (MEDLINE KIT) (PERIDEX) 0.12 % solution 15 mL  15 mL Mouth Rinse BID Karmen Bongo, MD   15 mL at 05/10/19 0839  . Chlorhexidine Gluconate Cloth 2 % PADS 6 each  6 each Topical Q0600 Karmen Bongo, MD   6 each at 05/10/19 1135  . Chlorhexidine Gluconate Cloth 2 % PADS 6 each  6 each Topical Daily Karmen Bongo, MD   6 each at 05/10/19 1059  .  clonazePAM (KLONOPIN) tablet 0.5 mg  0.5 mg Per Tube Q12H PRN Karmen Bongo, MD   0.5 mg at 05/10/19 1745  . enoxaparin (LOVENOX) injection 40 mg  40 mg Subcutaneous Q24H Karmen Bongo, MD   40 mg at 05/09/19 2015  . feeding supplement (JEVITY 1.2 CAL) liquid 1,000 mL  1,000 mL Per Tube Continuous Domenic Polite, MD 60 mL/hr at 05/10/19 1341 60 mL/hr at 05/10/19 1341  . feeding supplement (PRO-STAT SUGAR FREE 64) liquid 30 mL  30 mL Per Tube TID WC Karmen Bongo, MD   30 mL at 05/10/19 1543  . [START ON 05/11/2019] fludrocortisone (FLORINEF)  tablet 0.1 mg  0.1 mg Per Tube Once per day on Mon Thu Yates, Jennifer, MD      . free water 200 mL  200 mL Per Tube Q8H Hosie Poisson, MD   200 mL at 05/10/19 1530  . glycopyrrolate (ROBINUL) tablet 1 mg  1 mg Per Tube Lynne Logan, MD   1 mg at 05/10/19 1329  . magnesium oxide (MAG-OX) tablet 400 mg  400 mg Per Tube BID Karmen Bongo, MD   400 mg at 05/10/19 0839  . MEDLINE mouth rinse  15 mL Mouth Rinse 10 times per day Karmen Bongo, MD   15 mL at 05/10/19 1832  . midodrine (PROAMATINE) tablet 2.5 mg  2.5 mg Per Tube BID WC Karmen Bongo, MD   2.5 mg at 05/10/19 1543  . mupirocin ointment (BACTROBAN) 2 % 1 application  1 application Nasal BID Karmen Bongo, MD   1 application at 87/57/97 1136  . nutrition supplement (JUVEN) (JUVEN) powder packet 1 packet  1 packet Per Tube BID BM Domenic Polite, MD   1 packet at 05/10/19 1323  . oxyCODONE-acetaminophen (PERCOCET/ROXICET) 5-325 MG per tablet 1 tablet  1 tablet Per Tube Daily Karmen Bongo, MD   1 tablet at 05/10/19 0840  . pantoprazole sodium (PROTONIX) 40 mg/20 mL oral suspension 40 mg  40 mg Per Tube Daily Desai, Rahul P, PA-C   40 mg at 05/10/19 1135  . piperacillin-tazobactam (ZOSYN) IVPB 3.375 g  3.375 g Intravenous Lynne Logan, MD 12.5 mL/hr at 05/10/19 1331 3.375 g at 05/10/19 1331  . polyvinyl alcohol (LIQUIFILM TEARS) 1.4 % ophthalmic solution 1 drop  1 drop Both Eyes PRN Karmen Bongo, MD      . scopolamine (TRANSDERM-SCOP) 1 MG/3DAYS 1.5 mg  1 patch Transdermal Kyra Searles, MD   1.5 mg at 05/09/19 1007  . sodium bicarbonate tablet 650 mg  650 mg Per Tube QID Karmen Bongo, MD   650 mg at 05/10/19 1832  . sodium chloride flush (NS) 0.9 % injection 10-40 mL  10-40 mL Intracatheter Q12H Karmen Bongo, MD   10 mL at 05/10/19 1832  . sodium chloride flush (NS) 0.9 % injection 10-40 mL  10-40 mL Intracatheter PRN Karmen Bongo, MD      . vitamin C (ASCORBIC ACID) tablet 500 mg  500 mg Per Tube Daily  Karmen Bongo, MD   500 mg at 05/10/19 2820     Discharge Medications: Please see discharge summary for a list of discharge medications.  Relevant Imaging Results:  Relevant Lab Results:   Additional Information  SSN: [id number]  Oretha Milch, LCSW

## 2019-05-10 NOTE — Progress Notes (Signed)
CSW verified that patient has a current PASRR as new request is unable to be submitted. CSW attempted searching for patient with his suffix [name], common nicknames, and middle named [name]. CSW attempted to contact [name]. [employer] for assistance but was unable to reach as their offices close at 5PM. Will require weekday follow-up  Tenna Delaine, LCSW, LCAS-A Clinical Social Worker II [phone]

## 2019-05-10 NOTE — Progress Notes (Signed)
Spoke to patient regarding his earlier request to not return to [facility], he has been to [facility] and would like to go back there or go home to live with his brother.

## 2019-05-11 ENCOUNTER — Inpatient Hospital Stay (HOSPITAL_COMMUNITY): Payer: Medicare Other

## 2019-05-11 DIAGNOSIS — J9611 Chronic respiratory failure with hypoxia: Secondary | ICD-10-CM

## 2019-05-11 LAB — GLUCOSE, CAPILLARY
Glucose-Capillary: 103 mg/dL — ABNORMAL HIGH (ref 70–99)
Glucose-Capillary: 111 mg/dL — ABNORMAL HIGH (ref 70–99)
Glucose-Capillary: 99 mg/dL (ref 70–99)
Glucose-Capillary: 112 mg/dL — ABNORMAL HIGH (ref 70–99)
Glucose-Capillary: 113 mg/dL — ABNORMAL HIGH (ref 70–99)
Glucose-Capillary: 98 mg/dL (ref 70–99)

## 2019-05-11 LAB — CBC
HCT: 25.9 % — ABNORMAL LOW (ref 39.0–52.0)
Hemoglobin: 8 g/dL — ABNORMAL LOW (ref 13.0–17.0)
MCH: 30.1 pg (ref 26.0–34.0)
MCHC: 30.9 g/dL (ref 30.0–36.0)
MCV: 97.4 fL (ref 80.0–100.0)
Platelets: 146 10*3/uL — ABNORMAL LOW (ref 150–400)
RBC: 2.66 MIL/uL — ABNORMAL LOW (ref 4.22–5.81)
RDW: 18 % — ABNORMAL HIGH (ref 11.5–15.5)
WBC: 8.5 10*3/uL (ref 4.0–10.5)
nRBC: 0 % (ref 0.0–0.2)

## 2019-05-11 LAB — BPAM RBC
Blood Product Expiration Date: 202005272359
ISSUE DATE / TIME: 202005200835
Unit Type and Rh: 6200

## 2019-05-11 LAB — BASIC METABOLIC PANEL
Anion gap: 8 (ref 5–15)
BUN: 56 mg/dL — ABNORMAL HIGH (ref 8–23)
CO2: 21 mmol/L — ABNORMAL LOW (ref 22–32)
Calcium: 8.7 mg/dL — ABNORMAL LOW (ref 8.9–10.3)
Chloride: 118 mmol/L — ABNORMAL HIGH (ref 98–111)
Creatinine, Ser: 0.64 mg/dL (ref 0.61–1.24)
GFR calc Af Amer: >60 mL/min (ref >60)
GFR calc non Af Amer: >60 mL/min (ref >60)
Glucose, Bld: 123 mg/dL — ABNORMAL HIGH (ref 70–99)
Potassium: 4 mmol/L (ref 3.5–5.1)
Sodium: 147 mmol/L — ABNORMAL HIGH (ref 135–145)

## 2019-05-11 LAB — TYPE AND SCREEN
ABO/RH(D): A POS
Antibody Screen: NEGATIVE
Unit division: 0

## 2019-05-11 NOTE — Progress Notes (Signed)
Pt placed back on full support on documented settings. Pt tolerating well.
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
## 2019-05-11 NOTE — Progress Notes (Signed)
Pt on ATC currently tolerating well
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
## 2019-05-11 NOTE — Progress Notes (Signed)
Placed patient on TC 40%. No noted respiratory distress at this time.

## 2019-05-11 NOTE — Progress Notes (Signed)
eLink Physician-Brief Progress Note Patient Name: Shanton Kimmey DOB: 03/31/1956 MRN: [id number]   Date of Service  05/11/2019  HPI/Events of Note  Nursing concerned about oral secretions. Already on Scopolamine patch.   eICU Interventions  Will order: 1. Portable CXR at 5 AM.     Intervention Category Major Interventions: Hypoxemia - evaluation and management;Respiratory failure - evaluation and management  Domingos Riggi Dennard Nip 05/11/2019, 3:35 AM

## 2019-05-12 DIAGNOSIS — J9621 Acute and chronic respiratory failure with hypoxia: Secondary | ICD-10-CM

## 2019-05-12 DIAGNOSIS — R1310 Dysphagia, unspecified: Secondary | ICD-10-CM

## 2019-05-12 LAB — GLUCOSE, CAPILLARY
Glucose-Capillary: 106 mg/dL — ABNORMAL HIGH (ref 70–99)
Glucose-Capillary: 114 mg/dL — ABNORMAL HIGH (ref 70–99)
Glucose-Capillary: 118 mg/dL — ABNORMAL HIGH (ref 70–99)
Glucose-Capillary: 118 mg/dL — ABNORMAL HIGH (ref 70–99)
Glucose-Capillary: 121 mg/dL — ABNORMAL HIGH (ref 70–99)
Glucose-Capillary: 130 mg/dL — ABNORMAL HIGH (ref 70–99)

## 2019-05-12 LAB — CULTURE, BLOOD (ROUTINE X 2)
Culture: NO GROWTH
Culture: NO GROWTH
Special Requests: ADEQUATE
Special Requests: ADEQUATE

## 2019-05-12 LAB — BASIC METABOLIC PANEL
Anion gap: 10 (ref 5–15)
BUN: 44 mg/dL — ABNORMAL HIGH (ref 8–23)
CO2: 22 mmol/L (ref 22–32)
Calcium: 8.9 mg/dL (ref 8.9–10.3)
Chloride: 113 mmol/L — ABNORMAL HIGH (ref 98–111)
Creatinine, Ser: 0.56 mg/dL — ABNORMAL LOW (ref 0.61–1.24)
GFR calc Af Amer: >60 mL/min (ref >60)
GFR calc non Af Amer: >60 mL/min (ref >60)
Glucose, Bld: 112 mg/dL — ABNORMAL HIGH (ref 70–99)
Potassium: 3.9 mmol/L (ref 3.5–5.1)
Sodium: 145 mmol/L (ref 135–145)

## 2019-05-12 MED ORDER — RESOURCE THICKENUP CLEAR PO POWD
ORAL | Status: DC | PRN
  Filled 2019-05-12: qty 125
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
## 2019-05-12 NOTE — Progress Notes (Signed)
CSW attempted to reach Francis Osborne at [facility] in [place] to discuss bed availability. CSW did not receive a return call or message from [facility].  CSW to continue following.  Edwin Dada, MSW, LCSW-A Clinical Social Worker [facility] [phone]
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
## 2019-05-12 NOTE — Progress Notes (Signed)
CSW received call from [name] at [facility] and there are no beds available at this time, CSW to return call early next week to re-check.  Edwin Dada, MSW, LCSW-A Clinical Social Worker [facility] [phone]
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
## 2019-05-13 DIAGNOSIS — R0902 Hypoxemia: Secondary | ICD-10-CM

## 2019-05-13 LAB — CULTURE, BLOOD (ROUTINE X 2)
Culture: NO GROWTH
Culture: NO GROWTH

## 2019-05-13 LAB — GLUCOSE, CAPILLARY
Glucose-Capillary: 102 mg/dL — ABNORMAL HIGH (ref 70–99)
Glucose-Capillary: 105 mg/dL — ABNORMAL HIGH (ref 70–99)
Glucose-Capillary: 107 mg/dL — ABNORMAL HIGH (ref 70–99)
Glucose-Capillary: 112 mg/dL — ABNORMAL HIGH (ref 70–99)
Glucose-Capillary: 115 mg/dL — ABNORMAL HIGH (ref 70–99)
Glucose-Capillary: 99 mg/dL (ref 70–99)

## 2019-05-13 MED ORDER — SODIUM CHLORIDE 0.9 % IV SOLN
510.0000 mg | INTRAVENOUS | Status: DC
  Administered 2019-05-13: 15:00:00 510 mg via INTRAVENOUS
  Filled 2019-05-13: qty 17

## 2019-05-13 MED ORDER — MIDODRINE HCL 5 MG PO TABS
2.5000 mg | ORAL_TABLET | Freq: Two times a day (BID) | ORAL | Status: DC
  Administered 2019-05-13 – 2019-05-16 (×7): 2.5 mg
  Filled 2019-05-13 (×7): qty 1

## 2019-05-13 MED ORDER — SODIUM CHLORIDE 0.9 % IV SOLN
INTRAVENOUS | Status: DC | PRN
  Administered 2019-05-13: 17:00:00 via INTRAVENOUS

## 2019-05-13 MED ORDER — DEXTROSE 5 % IV SOLN
2.5000 g | Freq: Three times a day (TID) | INTRAVENOUS | Status: DC
  Administered 2019-05-13 – 2019-05-14 (×3): 2.5 g via INTRAVENOUS
  Filled 2019-05-13 (×5): qty 12

## 2019-05-13 NOTE — Progress Notes (Signed)
      INFECTIOUS DISEASE ATTENDING ADDENDUM:   Date: 05/13/2019  Patient name: Francis Osborne  Medical record number: [id number]  Date of birth: [birth year]   Called by Dr. Molli Knock  Patient is growing a CRE from tracheal  Aspirate.  While he came with hypoxia and low blood pressure he does not have fevers or seem HD unstable on zosyn. Per notes his low BP is chronic in context of his paralysis  He has SLIGHT increase in basilear infiltrate which is not overwhelming  I will change him to avycaz for now though it is NOT clear to me that this is an acute process.  He is clearly colonized with MDR organisms and at [facility] with recurrent bouts of antibiotics simply set up for MDR organisms  I will ask [facility] to send off for further testing and will formallly see the patient tomorrow.     Acey Lav 05/13/2019, 2:56 PM
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
## 2019-05-14 DIAGNOSIS — J95851 Ventilator associated pneumonia: Secondary | ICD-10-CM

## 2019-05-14 DIAGNOSIS — Z91048 Other nonmedicinal substance allergy status: Secondary | ICD-10-CM

## 2019-05-14 DIAGNOSIS — Z9911 Dependence on respirator [ventilator] status: Secondary | ICD-10-CM

## 2019-05-14 DIAGNOSIS — Z91041 Radiographic dye allergy status: Secondary | ICD-10-CM

## 2019-05-14 DIAGNOSIS — J9620 Acute and chronic respiratory failure, unspecified whether with hypoxia or hypercapnia: Secondary | ICD-10-CM

## 2019-05-14 DIAGNOSIS — F329 Major depressive disorder, single episode, unspecified: Secondary | ICD-10-CM

## 2019-05-14 DIAGNOSIS — A048 Other specified bacterial intestinal infections: Secondary | ICD-10-CM

## 2019-05-14 DIAGNOSIS — R002 Palpitations: Secondary | ICD-10-CM

## 2019-05-14 DIAGNOSIS — I9589 Other hypotension: Secondary | ICD-10-CM

## 2019-05-14 DIAGNOSIS — N2589 Other disorders resulting from impaired renal tubular function: Secondary | ICD-10-CM

## 2019-05-14 DIAGNOSIS — Z1619 Resistance to other specified beta lactam antibiotics: Secondary | ICD-10-CM

## 2019-05-14 LAB — CARBAPENEM RESISTANCE PANEL
Carba Resistance IMP Gene: NOT DETECTED
Carba Resistance KPC Gene: DETECTED — AB
Carba Resistance NDM Gene: NOT DETECTED
Carba Resistance OXA48 Gene: NOT DETECTED
Carba Resistance VIM Gene: NOT DETECTED

## 2019-05-14 LAB — GLUCOSE, CAPILLARY
Glucose-Capillary: 101 mg/dL — ABNORMAL HIGH (ref 70–99)
Glucose-Capillary: 105 mg/dL — ABNORMAL HIGH (ref 70–99)
Glucose-Capillary: 108 mg/dL — ABNORMAL HIGH (ref 70–99)
Glucose-Capillary: 110 mg/dL — ABNORMAL HIGH (ref 70–99)
Glucose-Capillary: 121 mg/dL — ABNORMAL HIGH (ref 70–99)

## 2019-05-14 MED ORDER — CLONAZEPAM 0.5 MG PO TABS
0.5000 mg | ORAL_TABLET | Freq: Once | ORAL | Status: AC
  Administered 2019-05-14: 0.5 mg

## 2019-05-14 MED ORDER — LOPERAMIDE HCL 1 MG/7.5ML PO SUSP
4.0000 mg | ORAL | Status: DC | PRN
  Administered 2019-05-14: 4 mg
  Filled 2019-05-14 (×3): qty 30

## 2019-05-14 MED ORDER — LORATADINE 5 MG/5ML PO SYRP
10.0000 mg | ORAL_SOLUTION | Freq: Every day | ORAL | Status: DC | PRN
  Administered 2019-05-14: 13:00:00 10 mg via ORAL
  Filled 2019-05-14 (×2): qty 10

## 2019-05-14 MED ORDER — LACTATED RINGERS IV BOLUS
500.0000 mL | Freq: Once | INTRAVENOUS | Status: AC
  Administered 2019-05-14: 23:00:00 500 mL via INTRAVENOUS
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
## 2019-05-14 NOTE — Progress Notes (Signed)
Checked in with social worker and no one is in the office at [facility] till Monday.

## 2019-05-14 NOTE — Progress Notes (Signed)
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  -Hospitalized 2/16 through 3/7 for E. coli and Klebsiella UTI, seizure, resultant tongue laceration, intubated for airway protection developed AKI with anasarca. -Was extubated and then reintubated and could not be weaned off the ventilator subsequently underwent tracheostomy on 2/23, discharged to [facility] on 3/20. -Subsequently he went to [facility] for continued ventilator weaning -He was sent to the emergency room at [facility] 5/18 with worsening hypoxia in the 70s, he was seen in the emergency room 2 days ago for mucous plug and discharged back.  Chest x-ray worsened now with multifocal pneumonia, worsening leukocytosis, worsening hypotension briefly required pressors.  Assessment & Plan:   Principal Problem:   Multifocal pneumonia Active Problems:   Tracheostomy tube present (HCC)   Quadriplegia (HCC)   S/P percutaneous endoscopic gastrostomy (PEG) tube placement (HCC)   Type 2 diabetes mellitus without complication (HCC)   Pressure injury of skin   Sepsis/multifocal pneumonia Patient is ventilator dependent since his hospitalization was at [facility] from 02/05/2019.  Patient is long-term trach.  Admitted for multifocal pneumonia started on broad-spectrum IV antibiotics discontinued on 05/14/2019 as pt is colonized with CRE.  COVID-19 PCR is negative  Currently on ATC with 40% oxygen . Able to tolerate trach collar. Will probably need a MV at night. Further management as per PCCM.  Currently waiting for a bed at [facility]. Possibly on Tuesday. speech therapy eval done and recommended nectar thick liquids .  Continue with pulmonary toilet Appreciate PCCM assistance with vent management.    Sacral decubitus ulcer Wound  care consulted and recommendations given.   Type 2 DM without complication CBG (last 3)  Recent Labs    05/13/19 2350 05/14/19 0352 05/14/19 0822  GLUCAP 105* 108* 121*   Resume SSI.    Chronic hypotension and RTA Continue with Florinef and midodrine.   History of quadriplegia status post trach/PEG placement with this chronic suprapubic catheter.     Loose BM increased frequency:  Prn imodium added.   Anxiety:  Prn klonipin.    Anemia of chronic disease:  Hemoglobin stable around 8.  Transfuse to keep hemoglobin greater than 7.    DVT prophylaxis: lovenox.  Code Status:  Full code.  Family Communication: none at bedside.  Disposition Plan: back to LTAC when bed available.    Consultants:   PCCM.   ID  Procedures:none.   Antimicrobials: none.   Subjective:  No distress, comfortable, denies any new complaints.    Objective: Vitals:   05/14/19 0600 05/14/19 0713 05/14/19 0937 05/14/19 1210  BP: 100/75 126/83    Pulse: 81 78 98 98  Resp: 14 16 (!) 22 (!) 22  Temp:      TempSrc:      SpO2: 99% 100% 100% 100%  Weight:        Intake/Output Summary (Last 24 hours) at 05/14/2019 1444 Last data filed at 05/14/2019 0600 Gross per 24 hour  Intake 1288.85 ml  Output 1483 ml  Net -194.15 ml   Filed Weights   05/11/19 0600 05/13/19 0341 05/14/19 0500  Weight: 81.8 kg 82 kg 80.9 kg    Examination:  General exam: Appears calm and comfortable , not in distress. On ATC this am. 40%Fio2. Respiratory system:  Coarse breath sounds , air entry  fair.  Cardiovascular system: S1 & S2 heard, RRR.  Gastrointestinal system: Abdomen is soft NT ND BS+ Central nervous system: Alert and oriented. No focal neurological deficits. Extremities:pedal edema present.  Skin: sacral decubitus ulcer.  Psychiatry: Mood & affect appropriate.     Data Reviewed: I have personally reviewed following labs and imaging studies  CBC: Recent Labs  Lab 05/08/19 1125 05/08/19  1450 05/09/19 0350 05/10/19 0430 05/10/19 2030 05/11/19 0351  WBC 22.6*  --  14.6* 10.5  --  8.5  NEUTROABS 18.8*  --  11.8*  --   --   --   HGB 8.7* 7.5* 7.1* 6.8* 8.3* 8.0*  HCT 27.8* 22.0* 22.8* 22.8* 27.1* 25.9*  MCV 97.5  --  98.3 101.3*  --  97.4  PLT 174  --  146* 145*  --  146*   Basic Metabolic Panel: Recent Labs  Lab 05/08/19 1125 05/08/19 1450 05/09/19 0350 05/10/19 0430 05/11/19 0351 05/12/19 0446  NA 145 146* 147* 147* 147* 145  K 4.8 4.1 3.7 3.7 4.0 3.9  CL 108  --  114* 115* 118* 113*  CO2 24  --  22 19* 21* 22  GLUCOSE 117*  --  94 143* 123* 112*  BUN 81*  --  70* 75* 56* 44*  CREATININE 1.18  --  1.02 0.75 0.64 0.56*  CALCIUM 9.1  --  8.3* 8.5* 8.7* 8.9   GFR: Estimated Creatinine Clearance: 105.1 mL/min (A) (by C-G formula based on SCr of 0.56 mg/dL (L)). Liver Function Tests: No results for input(s): AST, ALT, ALKPHOS, BILITOT, PROT, ALBUMIN in the last 168 hours. No results for input(s): LIPASE, AMYLASE in the last 168 hours. No results for input(s): AMMONIA in the last 168 hours. Coagulation Profile: No results for input(s): INR, PROTIME in the last 168 hours. Cardiac Enzymes: No results for input(s): CKTOTAL, CKMB, CKMBINDEX, TROPONINI in the last 168 hours. BNP (last 3 results) No results for input(s): PROBNP in the last 8760 hours. HbA1C: No results for input(s): HGBA1C in the last 72 hours. CBG: Recent Labs  Lab 05/13/19 1601 05/13/19 1927 05/13/19 2350 05/14/19 0352 05/14/19 0822  GLUCAP 99 115* 105* 108* 121*   Lipid Profile: No results for input(s): CHOL, HDL, LDLCALC, TRIG, CHOLHDL, LDLDIRECT in the last 72 hours. Thyroid Function Tests: No results for input(s): TSH, T4TOTAL, FREET4, T3FREE, THYROIDAB in the last 72 hours. Anemia Panel: No results for input(s): VITAMINB12, FOLATE, FERRITIN, TIBC, IRON, RETICCTPCT in the last 72 hours. Sepsis Labs: Recent Labs  Lab 05/08/19 1441 05/08/19 2008  PROCALCITON  --  0.69   LATICACIDVEN 1.3  --     Recent Results (from the past 240 hour(s))  Culture, blood (Routine X 2) w Reflex to ID Panel     Status: None   Collection Time: 05/07/19  9:01 AM  Result Value Ref Range Status   Specimen Description BLOOD LEFT FOREARM  Final   Special Requests   Final    BOTTLES DRAWN AEROBIC AND ANAEROBIC Blood Culture adequate volume   Culture   Final    NO GROWTH 5 DAYS Performed at [facility], [address], [place], [place] [address]    Report Status 05/12/2019 FINAL  Final  Culture, blood (Routine X 2) w Reflex to ID Panel     Status: None   Collection Time: 05/07/19  9:10 AM  Result Value Ref Range Status   Specimen Description BLOOD LEFT ARM  Final   Special Requests  Final    BOTTLES DRAWN AEROBIC AND ANAEROBIC Blood Culture adequate volume   Culture   Final    NO GROWTH 5 DAYS Performed at [facility], [address] [place], [place] [address]    Report Status 05/12/2019 FINAL  Final  Urine Culture     Status: Abnormal   Collection Time: 05/07/19  9:17 AM  Result Value Ref Range Status   Specimen Description URINE, RANDOM  Final   Special Requests   Final    NONE Performed at [facility], [address], [place], [place] [address]    Culture 30,000 COLONIES/mL YEAST (A)  Final   Report Status 05/08/2019 FINAL  Final  SARS Coronavirus 2 (CEPHEID- Performed in [facility] hospital lab), Hosp Order     Status: None   Collection Time: 05/08/19  1:52 PM  Result Value Ref Range Status   SARS Coronavirus 2 NEGATIVE NEGATIVE Final    Comment: (NOTE) If result is NEGATIVE SARS-CoV-2 target nucleic acids are NOT DETECTED. The SARS-CoV-2 RNA is generally detectable in upper and lower  respiratory specimens during the acute phase of infection. The lowest  concentration of SARS-CoV-2 viral copies this assay can detect is 250  copies / mL. A negative result does not preclude SARS-CoV-2 infection  and should not be used as the sole basis  for treatment or other  patient management decisions.  A negative result may occur with  improper specimen collection / handling, submission of specimen other  than nasopharyngeal swab, presence of viral mutation(s) within the  areas targeted by this assay, and inadequate number of viral copies  (<250 copies / mL). A negative result must be combined with clinical  observations, patient history, and epidemiological information. If result is POSITIVE SARS-CoV-2 target nucleic acids are DETECTED. The SARS-CoV-2 RNA is generally detectable in upper and lower  respiratory specimens dur ing the acute phase of infection.  Positive  results are indicative of active infection with SARS-CoV-2.  Clinical  correlation with patient history and other diagnostic information is  necessary to determine patient infection status.  Positive results do  not rule out bacterial infection or co-infection with other viruses. If result is PRESUMPTIVE POSTIVE SARS-CoV-2 nucleic acids MAY BE PRESENT.   A presumptive positive result was obtained on the submitted specimen  and confirmed on repeat testing.  While 2019 novel coronavirus  (SARS-CoV-2) nucleic acids may be present in the submitted sample  additional confirmatory testing may be necessary for epidemiological  and / or clinical management purposes  to differentiate between  SARS-CoV-2 and other Sarbecovirus currently known to infect humans.  If clinically indicated additional testing with an alternate test  methodology [phone]) is advised. The SARS-CoV-2 RNA is generally  detectable in upper and lower respiratory sp ecimens during the acute  phase of infection. The expected result is Negative. Fact Sheet for Patients:  BoilerBrush.com.cy Fact Sheet for Healthcare Providers: https://pope.com/ This test is not yet approved or cleared by the [place] FDA and has been authorized for detection and/or  diagnosis of SARS-CoV-2 by FDA under an Emergency Use Authorization (EUA).  This EUA will remain in effect (meaning this test can be used) for the duration of the COVID-19 declaration under Section 564(b)(1) of the Act, 21 U.S.C. section 360bbb-3(b)(1), unless the authorization is terminated or revoked sooner. Performed at [facility], [address], [place], [place] [address]   Culture, respiratory (non-expectorated)     Status: None  Collection Time: 05/08/19  2:41 PM  Result Value Ref Range Status   Specimen Description TRACHEAL ASPIRATE  Final   Special Requests NONE  Final   Gram Stain   Final    ABUNDANT WBC PRESENT,BOTH PMN AND MONONUCLEAR FEW GRAM NEGATIVE RODS Performed at [facility], [address] [place], [place] [address]    Culture   Final    FEW PSEUDOMONAS AERUGINOSA FEW KLEBSIELLA PNEUMONIAE ORGANISM 2 CONFIRMED CARBAPENEMASE RESISTANT ENTEROBACTERIACAE    Report Status 05/13/2019 FINAL  Final   Organism ID, Bacteria PSEUDOMONAS AERUGINOSA  Final   Organism ID, Bacteria KLEBSIELLA PNEUMONIAE  Final      Susceptibility   Klebsiella pneumoniae - MIC*    AMPICILLIN >=32 RESISTANT Resistant     CEFAZOLIN >=64 RESISTANT Resistant     CEFEPIME >=64 RESISTANT Resistant     CEFTAZIDIME >=64 RESISTANT Resistant     CEFTRIAXONE >=64 RESISTANT Resistant     CIPROFLOXACIN >=4 RESISTANT Resistant     GENTAMICIN >=16 RESISTANT Resistant     IMIPENEM 8 INTERMEDIATE Intermediate     TRIMETH/SULFA >=320 RESISTANT Resistant     AMPICILLIN/SULBACTAM >=32 RESISTANT Resistant     PIP/TAZO >=128 RESISTANT Resistant     Extended ESBL NEGATIVE Sensitive     * FEW KLEBSIELLA PNEUMONIAE   Pseudomonas aeruginosa - MIC*    CEFTAZIDIME 2 SENSITIVE Sensitive     CIPROFLOXACIN 1 SENSITIVE Sensitive     GENTAMICIN <=1 SENSITIVE Sensitive     IMIPENEM 2 SENSITIVE Sensitive     PIP/TAZO 8 SENSITIVE Sensitive     CEFEPIME 8 SENSITIVE Sensitive     * FEW PSEUDOMONAS  AERUGINOSA  Blood culture (routine x 2)     Status: None   Collection Time: 05/08/19  5:20 PM  Result Value Ref Range Status   Specimen Description BLOOD RIGHT HAND  Final   Special Requests   Final    BOTTLES DRAWN AEROBIC ONLY Blood Culture results may not be optimal due to an inadequate volume of blood received in culture bottles   Culture   Final    NO GROWTH 5 DAYS Performed at [facility], [address] [place], [place] [address]    Report Status 05/13/2019 FINAL  Final  Blood culture (routine x 2)     Status: None   Collection Time: 05/08/19  5:30 PM  Result Value Ref Range Status   Specimen Description BLOOD LEFT FINGER  Final   Special Requests   Final    BOTTLES DRAWN AEROBIC ONLY Blood Culture results may not be optimal due to an inadequate volume of blood received in culture bottles   Culture   Final    NO GROWTH 5 DAYS Performed at [facility], [address] [place], [place] [address]    Report Status 05/13/2019 FINAL  Final  MRSA PCR Screening     Status: Abnormal   Collection Time: 05/08/19  9:36 PM  Result Value Ref Range Status   MRSA by PCR POSITIVE (A) NEGATIVE Final    Comment:        The GeneXpert MRSA Assay (FDA approved for NASAL specimens only), is one component of a comprehensive MRSA colonization surveillance program. It is not intended to diagnose MRSA infection nor to guide or monitor treatment for MRSA infections. RESULT CALLED TO, READ BACK BY AND VERIFIED WITH: PAYNE,S RN 0001 05/09/2019 MITCHELL,L Performed at [facility], [address] [place], [place] [address]  C difficile quick scan w PCR reflex     Status: None   Collection Time: 05/10/19  8:45 PM  Result Value Ref Range Status   C Diff antigen NEGATIVE NEGATIVE Final   C Diff toxin NEGATIVE NEGATIVE Final   C Diff interpretation No C. difficile detected.  Final    Comment: Performed at [facility], [address] [place], [place] [address]          Radiology Studies: No results found.      Scheduled Meds: . sodium chloride   Intravenous Once  . baclofen  5 mg Per Tube TID  . chlorhexidine  15 mL Mouth/Throat BID  . Chlorhexidine Gluconate Cloth  6 each Topical Daily  . enoxaparin (LOVENOX) injection  40 mg Subcutaneous Q24H  . feeding supplement (PRO-STAT SUGAR FREE 64)  30 mL Per Tube TID WC  . fludrocortisone  0.1 mg Per Tube Once per day on Mon Thu  . free water  200 mL Per Tube Q8H  . glycopyrrolate  1 mg Per Tube Q8H  . magnesium oxide  400 mg Per Tube BID  . mouth rinse  15 mL Mouth Rinse 10 times per day  . midodrine  2.5 mg Per Tube BID WC  . nutrition supplement (JUVEN)  1 packet Per Tube BID BM  . oxyCODONE-acetaminophen  1 tablet Per Tube Daily  . pantoprazole sodium  40 mg Per Tube Daily  . scopolamine  1 patch Transdermal Q72H  . sodium bicarbonate  650 mg Per Tube QID  . sodium chloride flush  10-40 mL Intracatheter Q12H  . vitamin C  500 mg Per Tube Daily   Continuous Infusions: . sodium chloride 10 mL/hr at 05/14/19 0600  . feeding supplement (JEVITY 1.2 CAL) 1,000 mL (05/14/19 0342)  . ferumoxytol Stopped (05/13/19 1452)     LOS: 6 days    Time spent: 28 minutes    Kathlen Mody, MD [facility] Pager [phone]  If 7PM-7AM, please contact night-coverage [email or web address] Password [email or web address] 05/14/2019, 2:44 PM

## 2019-05-14 NOTE — Progress Notes (Signed)
RT note: patient meets SBT criteria to wean on ATC.  Patient currently sleeping at this time and not breathing over set ventilator rate.  Will place on ATC once awake.  Will continue to monitor.
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
## 2019-05-14 NOTE — Progress Notes (Signed)
RT note: patient placed on 40% ATC.  Cuff deflated and was able to wear PMV for a short period of time.  Patient tolerating well.  Will continue to monitor.

## 2019-05-15 LAB — BASIC METABOLIC PANEL
Anion gap: 8 (ref 5–15)
BUN: 32 mg/dL — ABNORMAL HIGH (ref 8–23)
CO2: 24 mmol/L (ref 22–32)
Calcium: 8.6 mg/dL — ABNORMAL LOW (ref 8.9–10.3)
Chloride: 106 mmol/L (ref 98–111)
Creatinine, Ser: 0.4 mg/dL — ABNORMAL LOW (ref 0.61–1.24)
GFR calc Af Amer: >60 mL/min (ref >60)
GFR calc non Af Amer: >60 mL/min (ref >60)
Glucose, Bld: 122 mg/dL — ABNORMAL HIGH (ref 70–99)
Potassium: 4.1 mmol/L (ref 3.5–5.1)
Sodium: 138 mmol/L (ref 135–145)

## 2019-05-15 LAB — CBC
HCT: 24.7 % — ABNORMAL LOW (ref 39.0–52.0)
Hemoglobin: 7.7 g/dL — ABNORMAL LOW (ref 13.0–17.0)
MCH: 29.6 pg (ref 26.0–34.0)
MCHC: 31.2 g/dL (ref 30.0–36.0)
MCV: 95 fL (ref 80.0–100.0)
Platelets: 145 10*3/uL — ABNORMAL LOW (ref 150–400)
RBC: 2.6 MIL/uL — ABNORMAL LOW (ref 4.22–5.81)
RDW: 15.2 % (ref 11.5–15.5)
WBC: 7.4 10*3/uL (ref 4.0–10.5)
nRBC: 0 % (ref 0.0–0.2)

## 2019-05-15 LAB — GLUCOSE, CAPILLARY
Glucose-Capillary: 121 mg/dL — ABNORMAL HIGH (ref 70–99)
Glucose-Capillary: 111 mg/dL — ABNORMAL HIGH (ref 70–99)
Glucose-Capillary: 96 mg/dL (ref 70–99)
Glucose-Capillary: 109 mg/dL — ABNORMAL HIGH (ref 70–99)
Glucose-Capillary: 107 mg/dL — ABNORMAL HIGH (ref 70–99)
Glucose-Capillary: 129 mg/dL — ABNORMAL HIGH (ref 70–99)
Glucose-Capillary: 107 mg/dL — ABNORMAL HIGH (ref 70–99)

## 2019-05-15 NOTE — Progress Notes (Signed)
PCCM:   Patient stable on TCT.  Pulmonary will follow for chronic trach management.   Josephine Igo, DO [facility] 05/15/2019 8:26 AM

## 2019-05-15 NOTE — Progress Notes (Signed)
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  -Hospitalized 2/16 through 3/7 for E. coli and Klebsiella UTI, seizure, resultant tongue laceration, intubated for airway protection developed AKI with anasarca. -Was extubated and then reintubated and could not be weaned off the ventilator subsequently underwent tracheostomy on 2/23, discharged to [facility] on 3/20. -Subsequently he went to [facility] for continued ventilator weaning -He was sent to the emergency room at [facility] 5/18 with worsening hypoxia in the 70s, he was seen in the emergency room 2 days ago for mucous plug and discharged back.  Chest x-ray worsened now with multifocal pneumonia, worsening leukocytosis, worsening hypotension briefly required pressors.  Assessment & Plan:   Principal Problem:   Multifocal pneumonia Active Problems:   Tracheostomy tube present (HCC)   Quadriplegia (HCC)   S/P percutaneous endoscopic gastrostomy (PEG) tube placement (HCC)   Type 2 diabetes mellitus without complication (HCC)   Pressure injury of skin   Sepsis/multifocal pneumonia Patient is ventilator dependent since his hospitalization was at [facility] from 02/05/2019.  Patient is long-term trach.  Admitted for multifocal pneumonia started on broad-spectrum IV antibiotics discontinued on 05/14/2019 as pt is colonized with CRE.  COVID-19 PCR is negative  Currently on ATC with 40% oxygen . Able to tolerate trach collar. Will probably need a MV at night. Further management as per PCCM.  Currently waiting for a bed at [facility]. Possibly on Tuesday. speech therapy eval done and recommended nectar thick liquids .  Continue with pulmonary toilet Appreciate PCCM assistance with vent management. Afebrile , normal wbc count.     Sacral decubitus ulcer Wound care consulted and recommendations given.   Type 2 DM without complication CBG (last 3)  Recent Labs    05/15/19 0337 05/15/19 0714 05/15/19 1105  GLUCAP 111* 96 109*   Resume SSI.    Chronic hypotension and RTA Continue with Florinef and midodrine.   History of quadriplegia status post trach/PEG placement with this chronic suprapubic catheter.     Loose BM increased frequency:  Prn imodium added.   Anxiety:  Prn klonipin.    Anemia of chronic disease:  Hemoglobin stable around 8.  Transfuse to keep hemoglobin greater than 7.    DVT prophylaxis: lovenox.  Code Status:  Full code.  Family Communication: none at bedside.  Disposition Plan: back to LTAC when bed available.    Consultants:   PCCM.   ID  Procedures:none.   Antimicrobials: none.   Subjective:  No distress, comfortable, denies any new complaints.    Objective: Vitals:   05/15/19 0900 05/15/19 1000 05/15/19 1100 05/15/19 1103  BP: 135/76 (!) 154/88  (!) 143/84  Pulse: 76 81  79  Resp: 18 (!) 22  (!) 21  Temp:   98.3 F (36.8 C)   TempSrc:   Oral   SpO2: 100% 100%  100%  Weight:        Intake/Output Summary (Last 24 hours) at 05/15/2019 1235 Last data filed at 05/15/2019 1000 Gross per 24 hour  Intake 1420 ml  Output 1850 ml  Net -430 ml   Filed Weights   05/13/19 0341 05/14/19 0500 05/15/19 0353  Weight: 82 kg 80.9 kg 81.2 kg    Examination:  General exam: Appears calm and comfortable , not in distress. On ATC this am.  40%Fio2. Respiratory system:  Coarse breath sounds , air entry fair.  Cardiovascular system: S1 & S2 heard, RRR.  Gastrointestinal system: Abdomen is soft NT ND BS+ Central nervous system: Alert and oriented. No focal neurological deficits. Extremities:pedal edema present.  Skin: sacral decubitus ulcer.  Psychiatry: Mood & affect appropriate.     Data Reviewed: I have personally reviewed following labs and imaging studies   CBC: Recent Labs  Lab 05/09/19 0350 05/10/19 0430 05/10/19 2030 05/11/19 0351 05/15/19 0331  WBC 14.6* 10.5  --  8.5 7.4  NEUTROABS 11.8*  --   --   --   --   HGB 7.1* 6.8* 8.3* 8.0* 7.7*  HCT 22.8* 22.8* 27.1* 25.9* 24.7*  MCV 98.3 101.3*  --  97.4 95.0  PLT 146* 145*  --  146* 145*   Basic Metabolic Panel: Recent Labs  Lab 05/09/19 0350 05/10/19 0430 05/11/19 0351 05/12/19 0446 05/15/19 0331  NA 147* 147* 147* 145 138  K 3.7 3.7 4.0 3.9 4.1  CL 114* 115* 118* 113* 106  CO2 22 19* 21* 22 24  GLUCOSE 94 143* 123* 112* 122*  BUN 70* 75* 56* 44* 32*  CREATININE 1.02 0.75 0.64 0.56* 0.40*  CALCIUM 8.3* 8.5* 8.7* 8.9 8.6*   GFR: Estimated Creatinine Clearance: 105.1 mL/min (A) (by C-G formula based on SCr of 0.4 mg/dL (L)). Liver Function Tests: No results for input(s): AST, ALT, ALKPHOS, BILITOT, PROT, ALBUMIN in the last 168 hours. No results for input(s): LIPASE, AMYLASE in the last 168 hours. No results for input(s): AMMONIA in the last 168 hours. Coagulation Profile: No results for input(s): INR, PROTIME in the last 168 hours. Cardiac Enzymes: No results for input(s): CKTOTAL, CKMB, CKMBINDEX, TROPONINI in the last 168 hours. BNP (last 3 results) No results for input(s): PROBNP in the last 8760 hours. HbA1C: No results for input(s): HGBA1C in the last 72 hours. CBG: Recent Labs  Lab 05/14/19 1929 05/14/19 2347 05/15/19 0337 05/15/19 0714 05/15/19 1105  GLUCAP 105* 101* 111* 96 109*   Lipid Profile: No results for input(s): CHOL, HDL, LDLCALC, TRIG, CHOLHDL, LDLDIRECT in the last 72 hours. Thyroid Function Tests: No results for input(s): TSH, T4TOTAL, FREET4, T3FREE, THYROIDAB in the last 72 hours. Anemia Panel: No results for input(s): VITAMINB12, FOLATE, FERRITIN, TIBC, IRON, RETICCTPCT in the last 72 hours. Sepsis Labs: Recent Labs  Lab 05/08/19 1441 05/08/19 2008  PROCALCITON  --  0.69  LATICACIDVEN 1.3  --     Recent Results (from the past  240 hour(s))  Culture, blood (Routine X 2) w Reflex to ID Panel     Status: None   Collection Time: 05/07/19  9:01 AM  Result Value Ref Range Status   Specimen Description BLOOD LEFT FOREARM  Final   Special Requests   Final    BOTTLES DRAWN AEROBIC AND ANAEROBIC Blood Culture adequate volume   Culture   Final    NO GROWTH 5 DAYS Performed at [facility], [address] [place], [place] [address]    Report Status 05/12/2019 FINAL  Final  Culture, blood (Routine X 2) w Reflex to ID Panel     Status: None   Collection Time: 05/07/19  9:10 AM  Result Value Ref Range Status   Specimen Description BLOOD LEFT ARM  Final   Special Requests   Final    BOTTLES DRAWN AEROBIC AND ANAEROBIC Blood Culture adequate volume   Culture   Final    NO GROWTH 5 DAYS  Performed at [facility], [address], [place], [place] [address]    Report Status 05/12/2019 FINAL  Final  Urine Culture     Status: Abnormal   Collection Time: 05/07/19  9:17 AM  Result Value Ref Range Status   Specimen Description URINE, RANDOM  Final   Special Requests   Final    NONE Performed at [facility], [address] [place], [place] [address]    Culture 30,000 COLONIES/mL YEAST (A)  Final   Report Status 05/08/2019 FINAL  Final  SARS Coronavirus 2 (CEPHEID- Performed in [facility] hospital lab), Hosp Order     Status: None   Collection Time: 05/08/19  1:52 PM  Result Value Ref Range Status   SARS Coronavirus 2 NEGATIVE NEGATIVE Final    Comment: (NOTE) If result is NEGATIVE SARS-CoV-2 target nucleic acids are NOT DETECTED. The SARS-CoV-2 RNA is generally detectable in upper and lower  respiratory specimens during the acute phase of infection. The lowest  concentration of SARS-CoV-2 viral copies this assay can detect is 250  copies / mL. A negative result does not preclude SARS-CoV-2 infection  and should not be used as the sole basis for treatment or other  patient management decisions.  A  negative result may occur with  improper specimen collection / handling, submission of specimen other  than nasopharyngeal swab, presence of viral mutation(s) within the  areas targeted by this assay, and inadequate number of viral copies  (<250 copies / mL). A negative result must be combined with clinical  observations, patient history, and epidemiological information. If result is POSITIVE SARS-CoV-2 target nucleic acids are DETECTED. The SARS-CoV-2 RNA is generally detectable in upper and lower  respiratory specimens dur ing the acute phase of infection.  Positive  results are indicative of active infection with SARS-CoV-2.  Clinical  correlation with patient history and other diagnostic information is  necessary to determine patient infection status.  Positive results do  not rule out bacterial infection or co-infection with other viruses. If result is PRESUMPTIVE POSTIVE SARS-CoV-2 nucleic acids MAY BE PRESENT.   A presumptive positive result was obtained on the submitted specimen  and confirmed on repeat testing.  While 2019 novel coronavirus  (SARS-CoV-2) nucleic acids may be present in the submitted sample  additional confirmatory testing may be necessary for epidemiological  and / or clinical management purposes  to differentiate between  SARS-CoV-2 and other Sarbecovirus currently known to infect humans.  If clinically indicated additional testing with an alternate test  methodology [phone]) is advised. The SARS-CoV-2 RNA is generally  detectable in upper and lower respiratory sp ecimens during the acute  phase of infection. The expected result is Negative. Fact Sheet for Patients:  BoilerBrush.com.cy Fact Sheet for Healthcare Providers: https://pope.com/ This test is not yet approved or cleared by the [place] FDA and has been authorized for detection and/or diagnosis of SARS-CoV-2 by FDA under an Emergency Use  Authorization (EUA).  This EUA will remain in effect (meaning this test can be used) for the duration of the COVID-19 declaration under Section 564(b)(1) of the Act, 21 U.S.C. section 360bbb-3(b)(1), unless the authorization is terminated or revoked sooner. Performed at [facility], [address], [place], [place] [address]   Culture, respiratory (non-expectorated)     Status: None (Preliminary result)   Collection Time: 05/08/19  2:41 PM  Result Value Ref Range Status   Specimen Description TRACHEAL ASPIRATE  Final   Special Requests  NONE  Final   Gram Stain   Final    ABUNDANT WBC PRESENT,BOTH PMN AND MONONUCLEAR FEW GRAM NEGATIVE RODS    Culture   Final    FEW PSEUDOMONAS AERUGINOSA FEW KLEBSIELLA PNEUMONIAE ORGANISM 2 CONFIRMED CARBAPENEMASE RESISTANT ENTEROBACTERIACAE Sent to [facility] for further susceptibility testing. Performed at [facility], [address] [place], [place] [address]    Report Status PENDING  Incomplete   Organism ID, Bacteria PSEUDOMONAS AERUGINOSA  Final   Organism ID, Bacteria KLEBSIELLA PNEUMONIAE  Final      Susceptibility   Klebsiella pneumoniae - MIC*    AMPICILLIN >=32 RESISTANT Resistant     CEFAZOLIN >=64 RESISTANT Resistant     CEFEPIME >=64 RESISTANT Resistant     CEFTAZIDIME >=64 RESISTANT Resistant     CEFTRIAXONE >=64 RESISTANT Resistant     CIPROFLOXACIN >=4 RESISTANT Resistant     GENTAMICIN >=16 RESISTANT Resistant     IMIPENEM 8 INTERMEDIATE Intermediate     TRIMETH/SULFA >=320 RESISTANT Resistant     AMPICILLIN/SULBACTAM >=32 RESISTANT Resistant     PIP/TAZO >=128 RESISTANT Resistant     Extended ESBL NEGATIVE Sensitive     * FEW KLEBSIELLA PNEUMONIAE   Pseudomonas aeruginosa - MIC*    CEFTAZIDIME 2 SENSITIVE Sensitive     CIPROFLOXACIN 1 SENSITIVE Sensitive     GENTAMICIN <=1 SENSITIVE Sensitive     IMIPENEM 2 SENSITIVE Sensitive     PIP/TAZO 8 SENSITIVE Sensitive     CEFEPIME 8 SENSITIVE Sensitive     * FEW  PSEUDOMONAS AERUGINOSA  Carbapenem Resistance Panel     Status: Abnormal   Collection Time: 05/08/19  2:41 PM  Result Value Ref Range Status   Carba Resistance IMP Gene NOT DETECTED NOT DETECTED Final   Carba Resistance VIM Gene NOT DETECTED NOT DETECTED Final   Carba Resistance NDM Gene NOT DETECTED NOT DETECTED Final   Carba Resistance KPC Gene DETECTED (A) NOT DETECTED Final   Carba Resistance OXA48 Gene NOT DETECTED NOT DETECTED Final    Comment: (NOTE) Cepheid Carba-R is an FDA-cleared nucleic acid amplification test  (NAAT)for the detection and differentiation of genes encoding the  most prevalent carbapenemases in bacterial isolate samples. Carbapenemase gene identification and implementation of comprehensive  infection control measures are recommended by the CDC to prevent the  spread of the resistant organisms. Performed at [facility], [address] [place], [place] [address]   Blood culture (routine x 2)     Status: None   Collection Time: 05/08/19  5:20 PM  Result Value Ref Range Status   Specimen Description BLOOD RIGHT HAND  Final   Special Requests   Final    BOTTLES DRAWN AEROBIC ONLY Blood Culture results may not be optimal due to an inadequate volume of blood received in culture bottles   Culture   Final    NO GROWTH 5 DAYS Performed at [facility], [address] [place], [place] [address]    Report Status 05/13/2019 FINAL  Final  Blood culture (routine x 2)     Status: None   Collection Time: 05/08/19  5:30 PM  Result Value Ref Range Status   Specimen Description BLOOD LEFT FINGER  Final   Special Requests   Final    BOTTLES DRAWN AEROBIC ONLY Blood Culture results may not be optimal due to an inadequate volume of blood received in culture bottles   Culture   Final    NO GROWTH 5 DAYS  Performed at [facility], [address] [place], [place] [address]    Report Status 05/13/2019 FINAL  Final  MRSA PCR Screening     Status: Abnormal    Collection Time: 05/08/19  9:36 PM  Result Value Ref Range Status   MRSA by PCR POSITIVE (A) NEGATIVE Final    Comment:        The GeneXpert MRSA Assay (FDA approved for NASAL specimens only), is one component of a comprehensive MRSA colonization surveillance program. It is not intended to diagnose MRSA infection nor to guide or monitor treatment for MRSA infections. RESULT CALLED TO, READ BACK BY AND VERIFIED WITH: PAYNE,S RN 0001 05/09/2019 MITCHELL,L Performed at [facility], [address], [place], [place] [address]   C difficile quick scan w PCR reflex     Status: None   Collection Time: 05/10/19  8:45 PM  Result Value Ref Range Status   C Diff antigen NEGATIVE NEGATIVE Final   C Diff toxin NEGATIVE NEGATIVE Final   C Diff interpretation No C. difficile detected.  Final    Comment: Performed at [facility], [address] [place], [place] [address]         Radiology Studies: No results found.      Scheduled Meds: . sodium chloride   Intravenous Once  . baclofen  5 mg Per Tube TID  . chlorhexidine  15 mL Mouth/Throat BID  . Chlorhexidine Gluconate Cloth  6 each Topical Daily  . enoxaparin (LOVENOX) injection  40 mg Subcutaneous Q24H  . feeding supplement (PRO-STAT SUGAR FREE 64)  30 mL Per Tube TID WC  . fludrocortisone  0.1 mg Per Tube Once per day on Mon Thu  . free water  200 mL Per Tube Q8H  . glycopyrrolate  1 mg Per Tube Q8H  . magnesium oxide  400 mg Per Tube BID  . mouth rinse  15 mL Mouth Rinse 10 times per day  . midodrine  2.5 mg Per Tube BID WC  . nutrition supplement (JUVEN)  1 packet Per Tube BID BM  . oxyCODONE-acetaminophen  1 tablet Per Tube Daily  . pantoprazole sodium  40 mg Per Tube Daily  . scopolamine  1 patch Transdermal Q72H  . sodium bicarbonate  650 mg Per Tube QID  . sodium chloride flush  10-40 mL Intracatheter Q12H  . vitamin C  500 mg Per Tube Daily   Continuous Infusions: . sodium chloride Stopped  (05/14/19 0617)  . feeding supplement (JEVITY 1.2 CAL) 60 mL/hr at 05/15/19 0600  . ferumoxytol Stopped (05/13/19 1452)     LOS: 7 days    Time spent: 26 minutes    Kathlen Mody, MD [facility] Pager [phone]  If 7PM-7AM, please contact night-coverage [email or web address] Password [facility] 05/15/2019, 12:35 PM

## 2019-05-15 NOTE — Progress Notes (Signed)
Patient ID: Francis Osborne, male   DOB: 03-17-1956, 63 y.o.   MRN: [id number]          [facility] for Infectious Disease    Date of Admission:  05/08/2019     He is resting quietly in bed on trach collar oxygen.  He remains afebrile.  I see no indication to restart antibiotics.  We will sign off now.         Cliffton Asters, MD [facility] for Infectious Disease [facility] [phone] pager   [phone] cell 05/15/2019, 10:12 AM

## 2019-05-16 LAB — GLUCOSE, CAPILLARY
Glucose-Capillary: 102 mg/dL — ABNORMAL HIGH (ref 70–99)
Glucose-Capillary: 113 mg/dL — ABNORMAL HIGH (ref 70–99)
Glucose-Capillary: 113 mg/dL — ABNORMAL HIGH (ref 70–99)
Glucose-Capillary: 111 mg/dL — ABNORMAL HIGH (ref 70–99)
Glucose-Capillary: 85 mg/dL (ref 70–99)

## 2019-05-16 LAB — SARS CORONAVIRUS 2 BY RT PCR (HOSPITAL ORDER, PERFORMED IN ~~LOC~~ HOSPITAL LAB): SARS Coronavirus 2: NEGATIVE

## 2019-05-16 MED ORDER — OXYCODONE-ACETAMINOPHEN 5-325 MG PO TABS: 1.0000 | ORAL_TABLET | Freq: Every day | ORAL | 0 refills | Status: DC

## 2019-05-16 MED ORDER — PANTOPRAZOLE SODIUM 40 MG PO PACK: 40.0000 mg | PACK | Freq: Every day | ORAL | Status: DC

## 2019-05-16 MED ORDER — LORATADINE 5 MG/5ML PO SYRP: 10.0000 mg | ORAL_SOLUTION | Freq: Every day | ORAL | 12 refills | Status: DC | PRN

## 2019-05-16 MED ORDER — LOPERAMIDE HCL 1 MG/7.5ML PO SUSP: 4.0000 mg | ORAL | 0 refills | Status: DC | PRN

## 2019-05-16 MED ORDER — JUVEN PO PACK: 1.0000 | PACK | Freq: Two times a day (BID) | ORAL | 0 refills | Status: DC

## 2019-05-16 MED ORDER — JEVITY 1.2 CAL PO LIQD: 1000.0000 mL | ORAL | 0 refills | Status: DC

## 2019-05-16 MED ORDER — FREE WATER: 200.0000 mL | Freq: Three times a day (TID) | Status: DC

## 2019-05-16 MED ORDER — ALBUTEROL SULFATE (2.5 MG/3ML) 0.083% IN NEBU: 2.5000 mg | INHALATION_SOLUTION | RESPIRATORY_TRACT | 12 refills | Status: DC | PRN

## 2019-05-16 MED ORDER — RESOURCE THICKENUP CLEAR PO POWD: ORAL | Status: DC

## 2019-05-16 MED ORDER — CHLORHEXIDINE GLUCONATE CLOTH 2 % EX PADS: 6.0000 | MEDICATED_PAD | Freq: Every day | CUTANEOUS | Status: DC

## 2019-05-16 MED ORDER — ALBUTEROL SULFATE (2.5 MG/3ML) 0.083% IN NEBU
2.5000 mg | INHALATION_SOLUTION | RESPIRATORY_TRACT | Status: DC | PRN
  Administered 2019-05-16: 2.5 mg via RESPIRATORY_TRACT
  Filled 2019-05-16: qty 3

## 2019-05-16 MED ORDER — POLYVINYL ALCOHOL 1.4 % OP SOLN: 1.0000 [drp] | OPHTHALMIC | 0 refills | Status: DC | PRN

## 2019-05-16 MED ORDER — CLONAZEPAM 0.5 MG PO TABS: 0.5000 mg | ORAL_TABLET | Freq: Two times a day (BID) | ORAL | 0 refills | Status: AC | PRN

## 2019-05-16 MED ORDER — CLONAZEPAM 0.5 MG PO TABS: 0.5000 mg | ORAL_TABLET | Freq: Two times a day (BID) | ORAL | 0 refills | Status: DC | PRN

## 2019-05-16 NOTE — TOC Transition Note (Signed)
Transition of Care [facility]) - CM/SW Discharge Note   Patient Details  Name: Francis Osborne MRN: [id number] Date of Birth: 12-26-1955  Transition of Care [facility]) CM/SW Contact:  Inis Sizer, LCSW Phone Number: 05/16/2019, 12:10 PM   Clinical Narrative:     CSW spoke with patient's brother Francis Osborne to inform him of discharge plan. Francis Osborne informed CSW that he had spoken with patient's RN earlier who had provided him with an update. Francis Osborne is agreeable to plan and states that if his brother wants to move from [facility], he will advocate on his behalf when that time comes. CSW encouraged Francis Osborne to reach out for assistance if any further needs arise, he stated agreement.   Patient is going to room 301. The accepting physician at [facility] is Dr. Leonia Reader. The number to call for report is [phone].  CSW arranged transport for patient to [facility] via [facility] [employer]. MD and RN aware and agreeable.   Final next level of care: Skilled Nursing Facility Barriers to Discharge: No Barriers Identified   Patient Goals and CMS Choice Patient states their goals for this hospitalization and ongoing recovery are:: Return to facility CMS Medicare.gov Compare Post Acute Care list provided to:: Other (Comment Required)(Francis Osborne ) Choice offered to / list presented to : Patient, Sibling  Discharge Placement              Patient chooses bed at: [facility] Patient to be transferred to facility by: [facility] Name of family member notified: Francis Osborne Patient and family notified of of transfer: 05/16/19  Discharge Plan and Services                                     Social Determinants of Health (SDOH) Interventions     Readmission Risk Interventions No flowsheet data found.

## 2019-05-16 NOTE — Progress Notes (Signed)
CSW received call from Dr. Blake Divine who had a conversation with patient using nonverbal communication to assess his willingness to return to [facility] if a bed became available. Dr. Blake Divine informed CSW that she asked the patient three separate times if he agreed to return to [facility] and the patient stated yes each time.   CSW placed call to [place] at [facility] to determine if there was bed availability today, currently awaiting return call.   Edwin Dada, MSW, LCSW-A Clinical Social Worker [facility] [phone]

## 2019-05-16 NOTE — Progress Notes (Signed)
Patient discharged to [facility] on stretcher. PICC intact Superpubic and Flexiseal intact. Belonging taken Trach intact and patent transported on Trach collar.

## 2019-05-16 NOTE — Discharge Summary (Signed)
Physician Discharge Summary  Francis Osborne:[id number] DOB: 1956/11/15 DOA: 05/08/2019  PCP: Crist Fat, MD  Admit date: 05/08/2019 Discharge date: 05/16/2019  [facility]  Recommendations for Outpatient Follow-up:  1. Follow up with PCP in 1-2 weeks 2. Please obtain BMP/CBC in one week 3. Follow up with SLP as recommended.    Discharge Condition: STABLE.  CODE STATUS:FULL CODE.  Diet recommendation:  Nectar thick clear liquid diet in addition to tube feeds.   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 -Hospitalized 2/16 through 3/7 for E. coli and Klebsiella UTI, seizure, resultant tongue laceration, intubated for airway protection developed AKI with anasarca. -Was extubated and then reintubated and could not be weaned off the ventilator subsequently underwent tracheostomy on 2/23,discharged to LTAC on 3/20. -Subsequently he went to [facility] for continued ventilator weaning -He was sent to the emergency room at [facility] 5/18 with worsening hypoxia in the 70s,he was seen in the emergency room 2 days ago for mucous plug and discharged back.Chest x-ray worsened now with multifocal pneumonia, worsening leukocytosis, worsening hypotension briefly required pressors.  Discharge Diagnoses:  Principal Problem:   Multifocal pneumonia Active Problems:   Tracheostomy tube present (HCC)   Quadriplegia (HCC)   S/P percutaneous endoscopic gastrostomy (PEG) tube placement (HCC)   Type 2 diabetes mellitus without complication (HCC)   Pressure injury of skin  Sepsis/multifocal pneumonia Patient is ventilator dependent since his hospitalization was at [facility] from 02/05/2019.  Patient is long-term trach.  Admitted for multifocal pneumonia started on broad-spectrum IV antibiotics discontinued on 05/14/2019 as pt is colonized with  CRE.  COVID-19 PCR is negative  Currently on ATC with 40% oxygen . Able to tolerate trach collar. Will probably need a MV at night. Further management as per PCCM.  Currently waiting for a bed at [facility].  speech therapy eval done and recommended nectar thick liquids .  Continue with pulmonary toilet Afebrile , normal wbc count.    Sacral decubitus ulcer Wound care consulted and recommendations given.   Type 2 DM without complication CBG (last 3)  Recent Labs    05/16/19 0334 05/16/19 0707 05/16/19 1121  GLUCAP 102* 113* 113*    Resume SSI.    Chronic hypotension and RTA Continue with Florinef and midodrine.   History of quadriplegia status post trach/PEG placement with this chronic suprapubic catheter.     Loose BM increased frequency:  Prn imodium added.   Anxiety:  Prn klonipin.    Anemia of chronic disease:  Hemoglobin stable around 8.  Transfuse to keep hemoglobin greater than 7.      Discharge Instructions  Discharge Instructions    Discharge instructions   Complete by:  As directed    Follow up with PCP IN ONE WEEK.     Allergies as of 05/16/2019      Reactions   Contrast Media [iodinated Diagnostic Agents]    Iodine       Medication List    STOP taking these medications   ibuprofen 100 MG/5ML suspension Commonly known as:  ADVIL     TAKE these medications   acetaminophen 325 MG tablet Commonly known as:  TYLENOL Place 650 mg into feeding tube every 4 (four) hours as needed for mild pain, moderate pain or headache.   albuterol (2.5 MG/3ML) 0.083% nebulizer solution Commonly known as:  PROVENTIL Take 3 mLs (2.5 mg total) by nebulization every  3 (three) hours as needed for wheezing or shortness of breath.   Baclofen 5 MG Tabs Place 5 mg into feeding tube 3 (three) times daily.   chlorhexidine 0.12 % solution Commonly known as:  PERIDEX Use as directed 15 mLs in the mouth or throat See admin instructions. By shift    Chlorhexidine Gluconate Cloth 2 % Pads Apply 6 each topically daily. Start taking on:  May 17, 2019   clonazePAM 0.5 MG tablet Commonly known as:  KLONOPIN Place 1 tablet (0.5 mg total) into feeding tube every 12 (twelve) hours as needed for anxiety.   feeding supplement (JEVITY 1.2 CAL) Liqd Place 1,000 mLs into feeding tube continuous.   nutrition supplement (JUVEN) Pack Place 1 packet into feeding tube 2 (two) times daily between meals.   feeding supplement (PRO-STAT SUGAR FREE 64) Liqd Place 30 mLs into feeding tube 3 (three) times daily with meals.   fludrocortisone 0.1 MG tablet Commonly known as:  FLORINEF Place 0.1 mg into feeding tube 2 (two) times a week. Monday and Thursday   free water Soln Place 200 mLs into feeding tube every 8 (eight) hours.   glycopyrrolate 1 MG tablet Commonly known as:  ROBINUL Place 1 mg into feeding tube every 8 (eight) hours.   hydrALAZINE 20 MG/ML injection Commonly known as:  APRESOLINE Inject into the vein every 4 (four) hours as needed (Systolic blood pressure greater than). 0.95 ml   loperamide HCl 1 MG/7.5ML suspension Commonly known as:  IMODIUM Place 30 mLs (4 mg total) into feeding tube as needed for diarrhea or loose stools.   loratadine 5 MG/5ML syrup Commonly known as:  CLARITIN Take 10 mLs (10 mg total) by mouth daily as needed for allergies or rhinitis.   magnesium oxide 400 MG tablet Commonly known as:  MAG-OX Place 400 mg into feeding tube 2 (two) times daily.   midodrine 2.5 MG tablet Commonly known as:  PROAMATINE Place 2.5 mg into feeding tube 2 (two) times daily with a meal.   MULTIVITAMIN ADULT PO 1 tablet by Gastric Tube route daily.   oxyCODONE-acetaminophen 5-325 MG tablet Commonly known as:  PERCOCET/ROXICET Place 1 tablet into feeding tube daily.   pantoprazole sodium 40 mg/20 mL Pack Commonly known as:  PROTONIX Place 20 mLs (40 mg total) into feeding tube daily. Start taking on:  May 17, 2019   polyvinyl alcohol 1.4 % ophthalmic solution Commonly known as:  LIQUIFILM TEARS Place 1 drop into both eyes as needed for dry eyes.   Resource ThickenUp Clear Powd As recommended.   scopolamine 1 MG/3DAYS Commonly known as:  TRANSDERM-SCOP Place 1 patch onto the skin every 3 (three) days.   sodium bicarbonate 325 MG tablet Place 650 mg into feeding tube 4 (four) times daily.   vitamin C 500 MG tablet Commonly known as:  ASCORBIC ACID Place 500 mg into feeding tube daily.      Follow-up Information    Leonia Reader, Barbara Cower, MD. Schedule an appointment as soon as possible for a visit in 1 week(s).   Specialty:  Internal Medicine Contact information: [address] [place] [address] [phone]          Allergies  Allergen Reactions  . Contrast Media [Iodinated Diagnostic Agents]   . Iodine     Consultations:  PCCM  ID   Procedures/Studies: Dg Chest Port 1 View  Result Date: 05/11/2019 CLINICAL DATA:  Respiratory failure EXAM: PORTABLE CHEST 1 VIEW COMPARISON:  05/08/2019 FINDINGS:  Cardiac shadow is stable. Left-sided PICC line and tracheostomy tube are noted in satisfactory position. Left basilar infiltrate is noted slightly progressed in the interval from the prior exam. No new focal infiltrate is seen. No bony abnormality is noted. IMPRESSION: Slight increase in left basilar infiltrate. Electronically Signed   By: Alcide Clever M.D.   On: 05/11/2019 07:49   Dg Chest Port 1 View  Result Date: 05/08/2019 CLINICAL DATA:  Shortness of breath EXAM: PORTABLE CHEST 1 VIEW COMPARISON:  05/07/2019 FINDINGS: Tracheostomy tube in satisfactory position. Left-sided PICC line with the tip projecting over the SVC. Hazy bilateral lower lobe airspace disease. No pleural effusion or pneumothorax. Stable cardiomediastinal silhouette. IMPRESSION: Bilateral hazy lower lobe airspace disease concerning for multilobar pneumonia. Electronically Signed   By: Elige Ko   On:  05/08/2019 11:49   Dg Chest Port 1 View  Result Date: 05/07/2019 CLINICAL DATA:  Hypoxia. EXAM: PORTABLE CHEST 1 VIEW COMPARISON:  Apr 29, 2019 FINDINGS: A left PICC line terminates in good position. No pneumothorax. Small effusions, left greater than right, stable on the right and mildly increased on the left. Mild opacity in left base is likely atelectasis. Infiltrate less likely. No change in the cardiomediastinal silhouette. The tracheostomy tube projects over the tracheal air column. IMPRESSION: 1. Small bilateral effusions, left greater than right, stable on the right and slightly increased on the left in the interval. Mild increased opacity in left base in the interval is favored represent atelectasis rather than developing infiltrate. Electronically Signed   By: Gerome [name] III M.D   On: 05/07/2019 08:52   Dg Chest Port 1 View  Result Date: 04/29/2019 CLINICAL DATA:  Patient arrived with [facility] from [facility] staff reported pt. desaturated with possible seizure episode " blank stare" last night , sent here for evaluation , pt. is ventilator dependent /quadriphlegic . Chest x-ray resu.*comment was truncated*desat on vent today EXAM: PORTABLE CHEST 1 VIEW COMPARISON:  None available FINDINGS: Tracheostomy tube noted. Normal cardiac silhouette. LEFT PICC line with tip in distal SVC. No infiltrate or pneumothorax. Small effusions noted. IMPRESSION: Small effusions. No pulmonary edema or pneumonia. Electronically Signed   By: Genevive Bi M.D.   On: 04/29/2019 06:07       Subjective:   Discharge Exam: Vitals:   05/16/19 1124 05/16/19 1200  BP: 126/81 (!) 141/89  Pulse: 99 92  Resp: (!) 26 (!) 31  Temp: 97.7 F (36.5 C)   SpO2: 99% 98%   Vitals:   05/16/19 0900 05/16/19 1000 05/16/19 1124 05/16/19 1200  BP: (!) 147/87 136/75 126/81 (!) 141/89  Pulse: 93 92 99 92  Resp: (!) 30 (!) 29 (!) 26 (!) 31  Temp:   97.7 F (36.5 C)   TempSrc:   Oral   SpO2: 96% 98% 99% 98%   Weight:        General: Pt is alert, awake, not in acute distress S/P TRACH .  Cardiovascular: RRR, S1/S2 +,  Respiratory: CTA bilaterally, no wheezing, no rhonchi Abdominal: Soft, NT, ND, bowel sounds +     The results of significant diagnostics from this hospitalization (including imaging, microbiology, ancillary and laboratory) are listed below for reference.     Microbiology: Recent Results (from the past 240 hour(s))  Culture, blood (Routine X 2) w Reflex to ID Panel     Status: None   Collection Time: 05/07/19  9:01 AM  Result Value Ref Range Status   Specimen Description BLOOD LEFT FOREARM  Final  Special Requests   Final    BOTTLES DRAWN AEROBIC AND ANAEROBIC Blood Culture adequate volume   Culture   Final    NO GROWTH 5 DAYS Performed at [facility], [address], [place], [place] [address]    Report Status 05/12/2019 FINAL  Final  Culture, blood (Routine X 2) w Reflex to ID Panel     Status: None   Collection Time: 05/07/19  9:10 AM  Result Value Ref Range Status   Specimen Description BLOOD LEFT ARM  Final   Special Requests   Final    BOTTLES DRAWN AEROBIC AND ANAEROBIC Blood Culture adequate volume   Culture   Final    NO GROWTH 5 DAYS Performed at [facility], [address] [place], [place] [address]    Report Status 05/12/2019 FINAL  Final  Urine Culture     Status: Abnormal   Collection Time: 05/07/19  9:17 AM  Result Value Ref Range Status   Specimen Description URINE, RANDOM  Final   Special Requests   Final    NONE Performed at [facility], [address] [place], [place] [address]    Culture 30,000 COLONIES/mL YEAST (A)  Final   Report Status 05/08/2019 FINAL  Final  SARS Coronavirus 2 (CEPHEID- Performed in [facility] hospital lab), Hosp Order     Status: None   Collection Time: 05/08/19  1:52 PM  Result Value Ref Range Status   SARS Coronavirus 2 NEGATIVE NEGATIVE Final    Comment: (NOTE) If result is  NEGATIVE SARS-CoV-2 target nucleic acids are NOT DETECTED. The SARS-CoV-2 RNA is generally detectable in upper and lower  respiratory specimens during the acute phase of infection. The lowest  concentration of SARS-CoV-2 viral copies this assay can detect is 250  copies / mL. A negative result does not preclude SARS-CoV-2 infection  and should not be used as the sole basis for treatment or other  patient management decisions.  A negative result may occur with  improper specimen collection / handling, submission of specimen other  than nasopharyngeal swab, presence of viral mutation(s) within the  areas targeted by this assay, and inadequate number of viral copies  (<250 copies / mL). A negative result must be combined with clinical  observations, patient history, and epidemiological information. If result is POSITIVE SARS-CoV-2 target nucleic acids are DETECTED. The SARS-CoV-2 RNA is generally detectable in upper and lower  respiratory specimens dur ing the acute phase of infection.  Positive  results are indicative of active infection with SARS-CoV-2.  Clinical  correlation with patient history and other diagnostic information is  necessary to determine patient infection status.  Positive results do  not rule out bacterial infection or co-infection with other viruses. If result is PRESUMPTIVE POSTIVE SARS-CoV-2 nucleic acids MAY BE PRESENT.   A presumptive positive result was obtained on the submitted specimen  and confirmed on repeat testing.  While 2019 novel coronavirus  (SARS-CoV-2) nucleic acids may be present in the submitted sample  additional confirmatory testing may be necessary for epidemiological  and / or clinical management purposes  to differentiate between  SARS-CoV-2 and other Sarbecovirus currently known to infect humans.  If clinically indicated additional testing with an alternate test  methodology [phone]) is advised. The SARS-CoV-2 RNA is generally  detectable  in upper and lower respiratory sp ecimens during the acute  phase of infection. The expected result is Negative. Fact Sheet for Patients:  BoilerBrush.com.cy Fact Sheet for  Healthcare Providers: https://pope.com/ This test is not yet approved or cleared by the [place] and has been authorized for detection and/or diagnosis of SARS-CoV-2 by FDA under an Emergency Use Authorization (EUA).  This EUA will remain in effect (meaning this test can be used) for the duration of the COVID-19 declaration under Section 564(b)(1) of the Act, 21 U.S.C. section 360bbb-3(b)(1), unless the authorization is terminated or revoked sooner. Performed at [facility], [address] [place], [place] [address]   Culture, respiratory (non-expectorated)     Status: None (Preliminary result)   Collection Time: 05/08/19  2:41 PM  Result Value Ref Range Status   Specimen Description TRACHEAL ASPIRATE  Final   Special Requests NONE  Final   Gram Stain   Final    ABUNDANT WBC PRESENT,BOTH PMN AND MONONUCLEAR FEW GRAM NEGATIVE RODS    Culture   Final    FEW PSEUDOMONAS AERUGINOSA FEW KLEBSIELLA PNEUMONIAE ORGANISM 2 CONFIRMED CARBAPENEMASE RESISTANT ENTEROBACTERIACAE Sent to [facility] for further susceptibility testing. Performed at [facility], [address] [place], [place] [address]    Report Status PENDING  Incomplete   Organism ID, Bacteria PSEUDOMONAS AERUGINOSA  Final   Organism ID, Bacteria KLEBSIELLA PNEUMONIAE  Final      Susceptibility   Klebsiella pneumoniae - MIC*    AMPICILLIN >=32 RESISTANT Resistant     CEFAZOLIN >=64 RESISTANT Resistant     CEFEPIME >=64 RESISTANT Resistant     CEFTAZIDIME >=64 RESISTANT Resistant     CEFTRIAXONE >=64 RESISTANT Resistant     CIPROFLOXACIN >=4 RESISTANT Resistant     GENTAMICIN >=16 RESISTANT Resistant     IMIPENEM 8 INTERMEDIATE Intermediate     TRIMETH/SULFA >=320 RESISTANT  Resistant     AMPICILLIN/SULBACTAM >=32 RESISTANT Resistant     PIP/TAZO >=128 RESISTANT Resistant     Extended ESBL NEGATIVE Sensitive     * FEW KLEBSIELLA PNEUMONIAE   Pseudomonas aeruginosa - MIC*    CEFTAZIDIME 2 SENSITIVE Sensitive     CIPROFLOXACIN 1 SENSITIVE Sensitive     GENTAMICIN <=1 SENSITIVE Sensitive     IMIPENEM 2 SENSITIVE Sensitive     PIP/TAZO 8 SENSITIVE Sensitive     CEFEPIME 8 SENSITIVE Sensitive     * FEW PSEUDOMONAS AERUGINOSA  Carbapenem Resistance Panel     Status: Abnormal   Collection Time: 05/08/19  2:41 PM  Result Value Ref Range Status   Carba Resistance IMP Gene NOT DETECTED NOT DETECTED Final   Carba Resistance VIM Gene NOT DETECTED NOT DETECTED Final   Carba Resistance NDM Gene NOT DETECTED NOT DETECTED Final   Carba Resistance KPC Gene DETECTED (A) NOT DETECTED Final   Carba Resistance OXA48 Gene NOT DETECTED NOT DETECTED Final    Comment: (NOTE) Cepheid Carba-R is an FDA-cleared nucleic acid amplification test  (NAAT)for the detection and differentiation of genes encoding the  most prevalent carbapenemases in bacterial isolate samples. Carbapenemase gene identification and implementation of comprehensive  infection control measures are recommended by the CDC to prevent the  spread of the resistant organisms. Performed at [facility], [address] [place], [place] [address]   Blood culture (routine x 2)     Status: None   Collection Time: 05/08/19  5:20 PM  Result Value Ref Range Status   Specimen Description BLOOD RIGHT HAND  Final   Special Requests   Final    BOTTLES DRAWN AEROBIC ONLY Blood Culture results may not be optimal due to  an inadequate volume of blood received in culture bottles   Culture   Final    NO GROWTH 5 DAYS Performed at [facility], [address] [place], [place] [address]    Report Status 05/13/2019 FINAL  Final  Blood culture (routine x 2)     Status: None   Collection Time: 05/08/19  5:30 PM   Result Value Ref Range Status   Specimen Description BLOOD LEFT FINGER  Final   Special Requests   Final    BOTTLES DRAWN AEROBIC ONLY Blood Culture results may not be optimal due to an inadequate volume of blood received in culture bottles   Culture   Final    NO GROWTH 5 DAYS Performed at [facility], [address] [place], [place] [address]    Report Status 05/13/2019 FINAL  Final  MRSA PCR Screening     Status: Abnormal   Collection Time: 05/08/19  9:36 PM  Result Value Ref Range Status   MRSA by PCR POSITIVE (A) NEGATIVE Final    Comment:        The GeneXpert MRSA Assay (FDA approved for NASAL specimens only), is one component of a comprehensive MRSA colonization surveillance program. It is not intended to diagnose MRSA infection nor to guide or monitor treatment for MRSA infections. RESULT CALLED TO, READ BACK BY AND VERIFIED WITH: PAYNE,S RN 0001 05/09/2019 MITCHELL,L Performed at [facility], [address], [place], [place] [address]   C difficile quick scan w PCR reflex     Status: None   Collection Time: 05/10/19  8:45 PM  Result Value Ref Range Status   C Diff antigen NEGATIVE NEGATIVE Final   C Diff toxin NEGATIVE NEGATIVE Final   C Diff interpretation No C. difficile detected.  Final    Comment: Performed at [facility], [address] [place], [place] [address]  SARS Coronavirus 2 (CEPHEID - Performed in [facility] hospital lab), Hosp Order     Status: None   Collection Time: 05/16/19 10:22 AM  Result Value Ref Range Status   SARS Coronavirus 2 NEGATIVE NEGATIVE Final    Comment: (NOTE) If result is NEGATIVE SARS-CoV-2 target nucleic acids are NOT DETECTED. The SARS-CoV-2 RNA is generally detectable in upper and lower  respiratory specimens during the acute phase of infection. The lowest  concentration of SARS-CoV-2 viral copies this assay can detect is 250  copies / mL. A negative result does not preclude SARS-CoV-2 infection  and  should not be used as the sole basis for treatment or other  patient management decisions.  A negative result may occur with  improper specimen collection / handling, submission of specimen other  than nasopharyngeal swab, presence of viral mutation(s) within the  areas targeted by this assay, and inadequate number of viral copies  (<250 copies / mL). A negative result must be combined with clinical  observations, patient history, and epidemiological information. If result is POSITIVE SARS-CoV-2 target nucleic acids are DETECTED. The SARS-CoV-2 RNA is generally detectable in upper and lower  respiratory specimens dur ing the acute phase of infection.  Positive  results are indicative of active infection with SARS-CoV-2.  Clinical  correlation with patient history and other diagnostic information is  necessary to determine patient infection status.  Positive results do  not rule out bacterial infection or co-infection with other viruses. If result is PRESUMPTIVE POSTIVE SARS-CoV-2 nucleic acids MAY BE PRESENT.   A presumptive positive result  was obtained on the submitted specimen  and confirmed on repeat testing.  While 2019 novel coronavirus  (SARS-CoV-2) nucleic acids may be present in the submitted sample  additional confirmatory testing may be necessary for epidemiological  and / or clinical management purposes  to differentiate between  SARS-CoV-2 and other Sarbecovirus currently known to infect humans.  If clinically indicated additional testing with an alternate test  methodology [phone]) is advised. The SARS-CoV-2 RNA is generally  detectable in upper and lower respiratory sp ecimens during the acute  phase of infection. The expected result is Negative. Fact Sheet for Patients:  BoilerBrush.com.cy Fact Sheet for Healthcare Providers: https://pope.com/ This test is not yet approved or cleared by the [place] FDA and has been  authorized for detection and/or diagnosis of SARS-CoV-2 by FDA under an Emergency Use Authorization (EUA).  This EUA will remain in effect (meaning this test can be used) for the duration of the COVID-19 declaration under Section 564(b)(1) of the Act, 21 U.S.C. section 360bbb-3(b)(1), unless the authorization is terminated or revoked sooner. Performed at [facility], [address] [place], [place] [address]      Labs: BNP (last 3 results) No results for input(s): BNP in the last 8760 hours. Basic Metabolic Panel: Recent Labs  Lab 05/10/19 0430 05/11/19 0351 05/12/19 0446 05/15/19 0331  NA 147* 147* 145 138  K 3.7 4.0 3.9 4.1  CL 115* 118* 113* 106  CO2 19* 21* 22 24  GLUCOSE 143* 123* 112* 122*  BUN 75* 56* 44* 32*  CREATININE 0.75 0.64 0.56* 0.40*  CALCIUM 8.5* 8.7* 8.9 8.6*   Liver Function Tests: No results for input(s): AST, ALT, ALKPHOS, BILITOT, PROT, ALBUMIN in the last 168 hours. No results for input(s): LIPASE, AMYLASE in the last 168 hours. No results for input(s): AMMONIA in the last 168 hours. CBC: Recent Labs  Lab 05/10/19 0430 05/10/19 2030 05/11/19 0351 05/15/19 0331  WBC 10.5  --  8.5 7.4  HGB 6.8* 8.3* 8.0* 7.7*  HCT 22.8* 27.1* 25.9* 24.7*  MCV 101.3*  --  97.4 95.0  PLT 145*  --  146* 145*   Cardiac Enzymes: No results for input(s): CKTOTAL, CKMB, CKMBINDEX, TROPONINI in the last 168 hours. BNP: Invalid input(s): POCBNP CBG: Recent Labs  Lab 05/15/19 1912 05/15/19 2319 05/16/19 0334 05/16/19 0707 05/16/19 1121  GLUCAP 129* 107* 102* 113* 113*   D-Dimer No results for input(s): DDIMER in the last 72 hours. Hgb A1c No results for input(s): HGBA1C in the last 72 hours. Lipid Profile No results for input(s): CHOL, HDL, LDLCALC, TRIG, CHOLHDL, LDLDIRECT in the last 72 hours. Thyroid function studies No results for input(s): TSH, T4TOTAL, T3FREE, THYROIDAB in the last 72 hours.  Invalid input(s): FREET3 Anemia work up No  results for input(s): VITAMINB12, FOLATE, FERRITIN, TIBC, IRON, RETICCTPCT in the last 72 hours. Urinalysis    Component Value Date/Time   COLORURINE YELLOW 05/08/2019 1441   APPEARANCEUR HAZY (A) 05/08/2019 1441   LABSPEC 1.016 05/08/2019 1441   PHURINE 8.0 05/08/2019 1441   GLUCOSEU NEGATIVE 05/08/2019 1441   HGBUR NEGATIVE 05/08/2019 1441   BILIRUBINUR NEGATIVE 05/08/2019 1441   KETONESUR NEGATIVE 05/08/2019 1441   PROTEINUR 30 (A) 05/08/2019 1441   NITRITE NEGATIVE 05/08/2019 1441   LEUKOCYTESUR LARGE (A) 05/08/2019 1441   Sepsis Labs Invalid input(s): PROCALCITONIN,  WBC,  LACTICIDVEN Microbiology Recent Results (from the past 240 hour(s))  Culture, blood (Routine X 2) w Reflex to ID Panel  Status: None   Collection Time: 05/07/19  9:01 AM  Result Value Ref Range Status   Specimen Description BLOOD LEFT FOREARM  Final   Special Requests   Final    BOTTLES DRAWN AEROBIC AND ANAEROBIC Blood Culture adequate volume   Culture   Final    NO GROWTH 5 DAYS Performed at [facility], [address] [place], [place] [address]    Report Status 05/12/2019 FINAL  Final  Culture, blood (Routine X 2) w Reflex to ID Panel     Status: None   Collection Time: 05/07/19  9:10 AM  Result Value Ref Range Status   Specimen Description BLOOD LEFT ARM  Final   Special Requests   Final    BOTTLES DRAWN AEROBIC AND ANAEROBIC Blood Culture adequate volume   Culture   Final    NO GROWTH 5 DAYS Performed at [facility], [address], [place], [place] [address]    Report Status 05/12/2019 FINAL  Final  Urine Culture     Status: Abnormal   Collection Time: 05/07/19  9:17 AM  Result Value Ref Range Status   Specimen Description URINE, RANDOM  Final   Special Requests   Final    NONE Performed at [facility], [address], [place], [place] [address]    Culture 30,000 COLONIES/mL YEAST (A)  Final   Report Status 05/08/2019 FINAL  Final  SARS Coronavirus 2 (CEPHEID-  Performed in [facility] hospital lab), Hosp Order     Status: None   Collection Time: 05/08/19  1:52 PM  Result Value Ref Range Status   SARS Coronavirus 2 NEGATIVE NEGATIVE Final    Comment: (NOTE) If result is NEGATIVE SARS-CoV-2 target nucleic acids are NOT DETECTED. The SARS-CoV-2 RNA is generally detectable in upper and lower  respiratory specimens during the acute phase of infection. The lowest  concentration of SARS-CoV-2 viral copies this assay can detect is 250  copies / mL. A negative result does not preclude SARS-CoV-2 infection  and should not be used as the sole basis for treatment or other  patient management decisions.  A negative result may occur with  improper specimen collection / handling, submission of specimen other  than nasopharyngeal swab, presence of viral mutation(s) within the  areas targeted by this assay, and inadequate number of viral copies  (<250 copies / mL). A negative result must be combined with clinical  observations, patient history, and epidemiological information. If result is POSITIVE SARS-CoV-2 target nucleic acids are DETECTED. The SARS-CoV-2 RNA is generally detectable in upper and lower  respiratory specimens dur ing the acute phase of infection.  Positive  results are indicative of active infection with SARS-CoV-2.  Clinical  correlation with patient history and other diagnostic information is  necessary to determine patient infection status.  Positive results do  not rule out bacterial infection or co-infection with other viruses. If result is PRESUMPTIVE POSTIVE SARS-CoV-2 nucleic acids MAY BE PRESENT.   A presumptive positive result was obtained on the submitted specimen  and confirmed on repeat testing.  While 2019 novel coronavirus  (SARS-CoV-2) nucleic acids may be present in the submitted sample  additional confirmatory testing may be necessary for epidemiological  and / or clinical management purposes  to differentiate between   SARS-CoV-2 and other Sarbecovirus currently known to infect humans.  If clinically indicated additional testing with an alternate test  methodology [phone]) is advised. The SARS-CoV-2 RNA is generally  detectable in  upper and lower respiratory sp ecimens during the acute  phase of infection. The expected result is Negative. Fact Sheet for Patients:  BoilerBrush.com.cy Fact Sheet for Healthcare Providers: https://pope.com/ This test is not yet approved or cleared by the [place] FDA and has been authorized for detection and/or diagnosis of SARS-CoV-2 by FDA under an Emergency Use Authorization (EUA).  This EUA will remain in effect (meaning this test can be used) for the duration of the COVID-19 declaration under Section 564(b)(1) of the Act, 21 U.S.C. section 360bbb-3(b)(1), unless the authorization is terminated or revoked sooner. Performed at [facility], 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 Performed at [facility], [address] [place], [place] [address]    Report Status PENDING  Incomplete   Organism ID, Bacteria PSEUDOMONAS AERUGINOSA  Final   Organism ID, Bacteria KLEBSIELLA PNEUMONIAE  Final      Susceptibility   Klebsiella pneumoniae - MIC*    AMPICILLIN >=32 RESISTANT Resistant     CEFAZOLIN >=64 RESISTANT Resistant     CEFEPIME >=64 RESISTANT Resistant     CEFTAZIDIME >=64 RESISTANT  Resistant     CEFTRIAXONE >=64 RESISTANT Resistant     CIPROFLOXACIN >=4 RESISTANT Resistant     GENTAMICIN >=16 RESISTANT Resistant     IMIPENEM 8 INTERMEDIATE Intermediate     TRIMETH/SULFA >=320 RESISTANT Resistant     AMPICILLIN/SULBACTAM >=32 RESISTANT Resistant     PIP/TAZO >=128 RESISTANT Resistant     Extended ESBL NEGATIVE Sensitive     * FEW KLEBSIELLA PNEUMONIAE   Pseudomonas aeruginosa - MIC*    CEFTAZIDIME 2 SENSITIVE Sensitive     CIPROFLOXACIN 1 SENSITIVE Sensitive     GENTAMICIN <=1 SENSITIVE Sensitive     IMIPENEM 2 SENSITIVE Sensitive     PIP/TAZO 8 SENSITIVE Sensitive     CEFEPIME 8 SENSITIVE Sensitive     * FEW PSEUDOMONAS AERUGINOSA  Carbapenem Resistance Panel     Status: Abnormal   Collection Time: 05/08/19  2:41 PM  Result Value Ref Range Status   Carba Resistance IMP Gene NOT DETECTED NOT DETECTED Final   Carba Resistance VIM Gene NOT DETECTED NOT DETECTED Final   Carba Resistance NDM Gene NOT DETECTED NOT DETECTED Final   Carba Resistance KPC Gene DETECTED (A) NOT DETECTED Final   Carba Resistance OXA48 Gene NOT DETECTED NOT DETECTED Final    Comment: (NOTE) Cepheid Carba-R is an FDA-cleared nucleic acid amplification test  (NAAT)for the detection and differentiation of genes encoding the  most prevalent carbapenemases in bacterial isolate samples. Carbapenemase gene identification and implementation of comprehensive  infection control measures are recommended by the CDC to prevent the  spread of the resistant organisms. Performed at [facility], [address], [place], [place] [address]   Blood culture (routine x 2)     Status: None   Collection Time: 05/08/19  5:20 PM  Result Value Ref Range Status   Specimen Description BLOOD  RIGHT HAND  Final   Special Requests   Final    BOTTLES DRAWN AEROBIC ONLY Blood Culture results may not be optimal due to an inadequate volume of blood received in culture bottles   Culture   Final    NO GROWTH 5  DAYS Performed at [facility], [address] [place], [place] [address]    Report Status 05/13/2019 FINAL  Final  Blood culture (routine x 2)     Status: None   Collection Time: 05/08/19  5:30 PM  Result Value Ref Range Status   Specimen Description BLOOD LEFT FINGER  Final   Special Requests   Final    BOTTLES DRAWN AEROBIC ONLY Blood Culture results may not be optimal due to an inadequate volume of blood received in culture bottles   Culture   Final    NO GROWTH 5 DAYS Performed at [facility], [address] [place], [place] [address]    Report Status 05/13/2019 FINAL  Final  MRSA PCR Screening     Status: Abnormal   Collection Time: 05/08/19  9:36 PM  Result Value Ref Range Status   MRSA by PCR POSITIVE (A) NEGATIVE Final    Comment:        The GeneXpert MRSA Assay (FDA approved for NASAL specimens only), is one component of a comprehensive MRSA colonization surveillance program. It is not intended to diagnose MRSA infection nor to guide or monitor treatment for MRSA infections. RESULT CALLED TO, READ BACK BY AND VERIFIED WITH: PAYNE,S RN 0001 05/09/2019 MITCHELL,L Performed at [facility], [address], [place], [place] [address]   C difficile quick scan w PCR reflex     Status: None   Collection Time: 05/10/19  8:45 PM  Result Value Ref Range Status   C Diff antigen NEGATIVE NEGATIVE Final   C Diff toxin NEGATIVE NEGATIVE Final   C Diff interpretation No C. difficile detected.  Final    Comment: Performed at [facility], [address], [place], [place] [address]  SARS Coronavirus 2 (CEPHEID - Performed in [facility] lab), Hosp Order     Status: None   Collection Time: 05/16/19 10:22 AM  Result Value Ref Range Status   SARS Coronavirus 2 NEGATIVE NEGATIVE Final    Comment: (NOTE) If result is NEGATIVE SARS-CoV-2 target nucleic acids are NOT DETECTED. The SARS-CoV-2 RNA is generally detectable in upper and lower   respiratory specimens during the acute phase of infection. The lowest  concentration of SARS-CoV-2 viral copies this assay can detect is 250  copies / mL. A negative result does not preclude SARS-CoV-2 infection  and should not be used as the sole basis for treatment or other  patient management decisions.  A negative result may occur with  improper specimen collection / handling, submission of specimen other  than nasopharyngeal swab, presence of viral mutation(s) within the  areas targeted by this assay, and inadequate number of viral copies  (<250 copies / mL). A negative result must be combined with clinical  observations, patient history, and epidemiological information. If result is POSITIVE SARS-CoV-2 target nucleic acids are DETECTED. The SARS-CoV-2 RNA is generally detectable in upper and lower  respiratory specimens dur ing the acute phase of infection.  Positive  results are indicative of active infection with SARS-CoV-2.  Clinical  correlation with patient history and other diagnostic information is  necessary to determine patient infection status.  Positive results do  not rule out bacterial infection or co-infection with other viruses. If result is PRESUMPTIVE POSTIVE SARS-CoV-2 nucleic acids MAY BE PRESENT.   A presumptive positive result was obtained on the submitted specimen  and confirmed on repeat testing.  While 2019 novel coronavirus  (SARS-CoV-2) nucleic acids may be present in the submitted sample  additional confirmatory testing may be necessary for epidemiological  and / or clinical management purposes  to differentiate between  SARS-CoV-2 and other Sarbecovirus currently known to infect humans.  If clinically indicated additional testing with an alternate test  methodology [phone]) is advised. The SARS-CoV-2 RNA is generally  detectable in upper and lower respiratory sp ecimens during the acute  phase of infection. The expected result is Negative. Fact  Sheet for Patients:  BoilerBrush.com.cyhttps://www.fda.gov/media/136312/download Fact Sheet for Healthcare Providers: https://pope.com/https://www.fda.gov/media/136313/download This test is not yet approved or cleared by the [place] FDA and has been authorized for detection and/or diagnosis of SARS-CoV-2 by FDA under an Emergency Use Authorization (EUA).  This EUA will remain in effect (meaning this test can be used) for the duration of the COVID-19 declaration under Section 564(b)(1) of the Act, 21 U.S.C. section 360bbb-3(b)(1), unless the authorization is terminated or revoked sooner. Performed at [facility], [address] [place], [place] [address]      Time coordinating discharge: 39 minutes  SIGNED:   Kathlen ModyVijaya Dimarco Minkin, MD  [facility] 05/16/2019, 1:08 PM Pager   If 7PM-7AM, please contact night-coverage [email or web address] Password [email or web address]

## 2019-05-16 NOTE — Progress Notes (Signed)
CSW spoke with Dr. Blake Divine regarding this patient's discharge plan. Dr. Blake Divine is agreeable that patient can return to [facility] if he is agreeable. CSW also asked MD to order new rapid COVID test.  CSW spoke with Chyrel Masson, RN to inform her of plan. RN asked patient if he was agreeable to return to [facility] and he communicated yes.  CSW will arrange for discharge back to [facility].  Edwin Dada, MSW, LCSW-A Clinical Social Worker [facility] [phone]

## 2019-05-16 NOTE — Progress Notes (Signed)
eLink Physician-Brief Progress Note Patient Name: Francis Osborne DOB: Jun 25, 1956 MRN: [id number]   Date of Service  05/16/2019  HPI/Events of Note  Nursing noticing mucous plugs.   eICU Interventions  Will order: 1. Albuterol 2.5 mg via neb Q 3 hours PRN.      Intervention Category Major Interventions: Other:  Sommer,Steven Dennard Nip 05/16/2019, 3:13 AM

## 2019-05-16 NOTE — Progress Notes (Signed)
VAST consulted d/t inability to flush PICC. VAST RN took down all lines and changed caps; good blood return and flushing easily with red and purple ports. Unit RN notified of results and asked to change all IV lines as well as change dressing today. She verbalized understanding.

## 2019-05-17 LAB — MIC RESULT

## 2019-05-17 LAB — MINIMUM INHIBITORY CONC. (1 DRUG)

## 2019-05-18 LAB — CULTURE, RESPIRATORY W GRAM STAIN

## 2019-05-19 LAB — MISCELLANEOUS TEST

## 2019-05-25 LAB — MISC LABCORP TEST (SEND OUT)
LabCorp test name: 2
Labcorp test code: 88005

## 2019-06-08 ENCOUNTER — Encounter (HOSPITAL_COMMUNITY): Payer: Self-pay

## 2019-06-08 ENCOUNTER — Emergency Department (HOSPITAL_COMMUNITY): Payer: Medicare Other

## 2019-06-08 ENCOUNTER — Other Ambulatory Visit: Payer: Self-pay

## 2019-06-08 ENCOUNTER — Inpatient Hospital Stay (HOSPITAL_COMMUNITY)
Admission: EM | Admit: 2019-06-08 | Discharge: 2019-06-13 | DRG: 870 | Disposition: A | Discharge status: Other Acute Inpatient Hospital | Payer: Medicare Other | Attending: Pulmonary Disease | Admitting: Pulmonary Disease

## 2019-06-08 DIAGNOSIS — R740 Nonspecific elevation of levels of transaminase and lactic acid dehydrogenase [LDH]: Secondary | ICD-10-CM | POA: Diagnosis present

## 2019-06-08 DIAGNOSIS — J9611 Chronic respiratory failure with hypoxia: Secondary | ICD-10-CM | POA: Diagnosis present

## 2019-06-08 DIAGNOSIS — Z20828 Contact with and (suspected) exposure to other viral communicable diseases: Secondary | ICD-10-CM | POA: Diagnosis not present

## 2019-06-08 DIAGNOSIS — G8250 Quadriplegia, unspecified: Secondary | ICD-10-CM | POA: Diagnosis not present

## 2019-06-08 DIAGNOSIS — A4159 Other Gram-negative sepsis: Secondary | ICD-10-CM | POA: Diagnosis present

## 2019-06-08 DIAGNOSIS — N310 Uninhibited neuropathic bladder, not elsewhere classified: Secondary | ICD-10-CM | POA: Diagnosis not present

## 2019-06-08 DIAGNOSIS — Z20822 Contact with and (suspected) exposure to covid-19: Secondary | ICD-10-CM

## 2019-06-08 DIAGNOSIS — R1310 Dysphagia, unspecified: Secondary | ICD-10-CM | POA: Diagnosis not present

## 2019-06-08 DIAGNOSIS — Y95 Nosocomial condition: Secondary | ICD-10-CM | POA: Diagnosis not present

## 2019-06-08 DIAGNOSIS — J9601 Acute respiratory failure with hypoxia: Secondary | ICD-10-CM | POA: Diagnosis not present

## 2019-06-08 DIAGNOSIS — Y848 Other medical procedures as the cause of abnormal reaction of the patient, or of later complication, without mention of misadventure at the time of the procedure: Secondary | ICD-10-CM | POA: Diagnosis not present

## 2019-06-08 DIAGNOSIS — L89220 Pressure ulcer of left hip, unstageable: Secondary | ICD-10-CM | POA: Diagnosis present

## 2019-06-08 DIAGNOSIS — J9691 Respiratory failure, unspecified with hypoxia: Secondary | ICD-10-CM

## 2019-06-08 DIAGNOSIS — R1110 Vomiting, unspecified: Secondary | ICD-10-CM

## 2019-06-08 DIAGNOSIS — I9589 Other hypotension: Secondary | ICD-10-CM | POA: Diagnosis not present

## 2019-06-08 DIAGNOSIS — J9621 Acute and chronic respiratory failure with hypoxia: Secondary | ICD-10-CM | POA: Diagnosis not present

## 2019-06-08 DIAGNOSIS — Z79891 Long term (current) use of opiate analgesic: Secondary | ICD-10-CM

## 2019-06-08 DIAGNOSIS — G92 Toxic encephalopathy: Secondary | ICD-10-CM | POA: Diagnosis not present

## 2019-06-08 DIAGNOSIS — Z79899 Other long term (current) drug therapy: Secondary | ICD-10-CM

## 2019-06-08 DIAGNOSIS — F419 Anxiety disorder, unspecified: Secondary | ICD-10-CM | POA: Diagnosis present

## 2019-06-08 DIAGNOSIS — J95851 Ventilator associated pneumonia: Secondary | ICD-10-CM | POA: Diagnosis present

## 2019-06-08 DIAGNOSIS — E1122 Type 2 diabetes mellitus with diabetic chronic kidney disease: Secondary | ICD-10-CM | POA: Diagnosis present

## 2019-06-08 DIAGNOSIS — R6521 Severe sepsis with septic shock: Secondary | ICD-10-CM | POA: Diagnosis present

## 2019-06-08 DIAGNOSIS — D696 Thrombocytopenia, unspecified: Secondary | ICD-10-CM | POA: Diagnosis not present

## 2019-06-08 DIAGNOSIS — Z6824 Body mass index (BMI) 24.0-24.9, adult: Secondary | ICD-10-CM

## 2019-06-08 DIAGNOSIS — J189 Pneumonia, unspecified organism: Secondary | ICD-10-CM

## 2019-06-08 DIAGNOSIS — L89153 Pressure ulcer of sacral region, stage 3: Secondary | ICD-10-CM | POA: Diagnosis not present

## 2019-06-08 DIAGNOSIS — G40509 Epileptic seizures related to external causes, not intractable, without status epilepticus: Secondary | ICD-10-CM | POA: Diagnosis not present

## 2019-06-08 DIAGNOSIS — J151 Pneumonia due to Pseudomonas: Secondary | ICD-10-CM | POA: Diagnosis not present

## 2019-06-08 DIAGNOSIS — L89159 Pressure ulcer of sacral region, unspecified stage: Secondary | ICD-10-CM | POA: Diagnosis not present

## 2019-06-08 DIAGNOSIS — I959 Hypotension, unspecified: Secondary | ICD-10-CM | POA: Diagnosis not present

## 2019-06-08 DIAGNOSIS — D631 Anemia in chronic kidney disease: Secondary | ICD-10-CM | POA: Diagnosis not present

## 2019-06-08 DIAGNOSIS — Z931 Gastrostomy status: Secondary | ICD-10-CM

## 2019-06-08 DIAGNOSIS — Z888 Allergy status to other drugs, medicaments and biological substances status: Secondary | ICD-10-CM

## 2019-06-08 DIAGNOSIS — Z9911 Dependence on respirator [ventilator] status: Secondary | ICD-10-CM | POA: Diagnosis not present

## 2019-06-08 DIAGNOSIS — N390 Urinary tract infection, site not specified: Secondary | ICD-10-CM | POA: Diagnosis not present

## 2019-06-08 DIAGNOSIS — N181 Chronic kidney disease, stage 1: Secondary | ICD-10-CM | POA: Diagnosis present

## 2019-06-08 DIAGNOSIS — E870 Hyperosmolality and hypernatremia: Secondary | ICD-10-CM

## 2019-06-08 DIAGNOSIS — J9690 Respiratory failure, unspecified, unspecified whether with hypoxia or hypercapnia: Secondary | ICD-10-CM

## 2019-06-08 DIAGNOSIS — E43 Unspecified severe protein-calorie malnutrition: Secondary | ICD-10-CM | POA: Diagnosis present

## 2019-06-08 DIAGNOSIS — E860 Dehydration: Secondary | ICD-10-CM

## 2019-06-08 DIAGNOSIS — A419 Sepsis, unspecified organism: Secondary | ICD-10-CM | POA: Diagnosis present

## 2019-06-08 DIAGNOSIS — Z91041 Radiographic dye allergy status: Secondary | ICD-10-CM

## 2019-06-08 DIAGNOSIS — Z930 Tracheostomy status: Secondary | ICD-10-CM

## 2019-06-08 DIAGNOSIS — J9692 Respiratory failure, unspecified with hypercapnia: Secondary | ICD-10-CM

## 2019-06-08 DIAGNOSIS — E872 Acidosis: Secondary | ICD-10-CM | POA: Diagnosis not present

## 2019-06-08 HISTORY — DX: Pressure ulcer of sacral region, stage 3: L89.153

## 2019-06-08 HISTORY — DX: Dysphagia, unspecified: R13.10

## 2019-06-08 HISTORY — DX: Disorder of the autonomic nervous system, unspecified: G90.9

## 2019-06-08 HISTORY — DX: Retention of urine, unspecified: R33.9

## 2019-06-08 HISTORY — DX: Uninhibited neuropathic bladder, not elsewhere classified: N31.0

## 2019-06-08 HISTORY — DX: Encephalopathy, unspecified: G93.40

## 2019-06-08 HISTORY — DX: Chronic kidney disease, unspecified: N18.9

## 2019-06-08 LAB — POCT I-STAT 7, (LYTES, BLD GAS, ICA,H+H)
Acid-base deficit: 9 mmol/L — ABNORMAL HIGH (ref 0.0–2.0)
Bicarbonate: 15.1 mmol/L — ABNORMAL LOW (ref 20.0–28.0)
Calcium, Ion: 1.43 mmol/L — ABNORMAL HIGH (ref 1.15–1.40)
HCT: 20 % — ABNORMAL LOW (ref 39.0–52.0)
Hemoglobin: 6.8 g/dL — CL (ref 13.0–17.0)
O2 Saturation: 99 %
Patient temperature: 98.6
Potassium: 4.4 mmol/L (ref 3.5–5.1)
Sodium: 151 mmol/L — ABNORMAL HIGH (ref 135–145)
TCO2: 16 mmol/L — ABNORMAL LOW (ref 22–32)
pCO2 arterial: 25.8 mmHg — ABNORMAL LOW (ref 32.0–48.0)
pH, Arterial: 7.377 (ref 7.350–7.450)
pO2, Arterial: 133 mmHg — ABNORMAL HIGH (ref 83.0–108.0)

## 2019-06-08 LAB — I-STAT CHEM 8, ED
BUN: 66 mg/dL — ABNORMAL HIGH (ref 8–23)
Calcium, Ion: 1.5 mmol/L — ABNORMAL HIGH (ref 1.15–1.40)
Chloride: 121 mmol/L — ABNORMAL HIGH (ref 98–111)
Creatinine, Ser: 0.6 mg/dL — ABNORMAL LOW (ref 0.61–1.24)
Glucose, Bld: 118 mg/dL — ABNORMAL HIGH (ref 70–99)
HCT: 27 % — ABNORMAL LOW (ref 39.0–52.0)
Hemoglobin: 9.2 g/dL — ABNORMAL LOW (ref 13.0–17.0)
Potassium: 4.7 mmol/L (ref 3.5–5.1)
Sodium: 150 mmol/L — ABNORMAL HIGH (ref 135–145)
TCO2: 24 mmol/L (ref 22–32)

## 2019-06-08 LAB — CBC WITH DIFFERENTIAL/PLATELET
Abs Immature Granulocytes: 0 10*3/uL (ref 0.00–0.07)
Basophils Absolute: 0 10*3/uL (ref 0.0–0.1)
Basophils Relative: 0 %
Eosinophils Absolute: 0.2 10*3/uL (ref 0.0–0.5)
Eosinophils Relative: 3 %
HCT: 30.3 % — ABNORMAL LOW (ref 39.0–52.0)
Hemoglobin: 8.5 g/dL — ABNORMAL LOW (ref 13.0–17.0)
Lymphocytes Relative: 10 %
Lymphs Abs: 0.6 10*3/uL — ABNORMAL LOW (ref 0.7–4.0)
MCH: 30.6 pg (ref 26.0–34.0)
MCHC: 28.1 g/dL — ABNORMAL LOW (ref 30.0–36.0)
MCV: 109 fL — ABNORMAL HIGH (ref 80.0–100.0)
Monocytes Absolute: 0.2 10*3/uL (ref 0.1–1.0)
Monocytes Relative: 4 %
Neutro Abs: 4.6 10*3/uL (ref 1.7–7.7)
Neutrophils Relative %: 83 %
Platelets: UNDETERMINED 10*3/uL (ref 150–400)
RBC: 2.78 MIL/uL — ABNORMAL LOW (ref 4.22–5.81)
RDW: 18.6 % — ABNORMAL HIGH (ref 11.5–15.5)
WBC: 5.6 10*3/uL (ref 4.0–10.5)
nRBC: 2.1 % — ABNORMAL HIGH (ref 0.0–0.2)
nRBC: 7 /100 WBC — ABNORMAL HIGH

## 2019-06-08 LAB — COMPREHENSIVE METABOLIC PANEL
ALT: 58 U/L — ABNORMAL HIGH (ref 0–44)
AST: 70 U/L — ABNORMAL HIGH (ref 15–41)
Albumin: 1.8 g/dL — ABNORMAL LOW (ref 3.5–5.0)
Alkaline Phosphatase: 242 U/L — ABNORMAL HIGH (ref 38–126)
Anion gap: 8 (ref 5–15)
BUN: 59 mg/dL — ABNORMAL HIGH (ref 8–23)
CO2: 22 mmol/L (ref 22–32)
Calcium: 10.5 mg/dL — ABNORMAL HIGH (ref 8.9–10.3)
Chloride: 119 mmol/L — ABNORMAL HIGH (ref 98–111)
Creatinine, Ser: 0.66 mg/dL (ref 0.61–1.24)
GFR calc Af Amer: >60 mL/min (ref >60)
GFR calc non Af Amer: >60 mL/min (ref >60)
Glucose, Bld: 122 mg/dL — ABNORMAL HIGH (ref 70–99)
Potassium: 5 mmol/L (ref 3.5–5.1)
Sodium: 149 mmol/L — ABNORMAL HIGH (ref 135–145)
Total Bilirubin: 1.8 mg/dL — ABNORMAL HIGH (ref 0.3–1.2)
Total Protein: 6.9 g/dL (ref 6.5–8.1)

## 2019-06-08 LAB — LACTIC ACID, PLASMA
Lactic Acid, Venous: 2.8 mmol/L (ref 0.5–1.9)
Lactic Acid, Venous: 3.9 mmol/L (ref 0.5–1.9)

## 2019-06-08 LAB — URINALYSIS, ROUTINE W REFLEX MICROSCOPIC
Bilirubin Urine: NEGATIVE
Glucose, UA: NEGATIVE mg/dL
Ketones, ur: NEGATIVE mg/dL
Nitrite: NEGATIVE
Protein, ur: 100 mg/dL — AB
Specific Gravity, Urine: <1.005 — ABNORMAL LOW (ref 1.005–1.030)
pH: >9 — ABNORMAL HIGH (ref 5.0–8.0)

## 2019-06-08 LAB — URINALYSIS, MICROSCOPIC (REFLEX)

## 2019-06-08 LAB — SARS CORONAVIRUS 2: SARS Coronavirus 2: NOT DETECTED

## 2019-06-08 LAB — HEMOGLOBIN A1C
Hgb A1c MFr Bld: 5.6 % (ref 4.8–5.6)
Mean Plasma Glucose: 114.02 mg/dL

## 2019-06-08 LAB — GLUCOSE, CAPILLARY: Glucose-Capillary: 79 mg/dL (ref 70–99)

## 2019-06-08 LAB — MRSA PCR SCREENING: MRSA by PCR: POSITIVE — AB

## 2019-06-08 LAB — MAGNESIUM: Magnesium: 2.9 mg/dL — ABNORMAL HIGH (ref 1.7–2.4)

## 2019-06-08 LAB — PHOSPHORUS: Phosphorus: 2.5 mg/dL (ref 2.5–4.6)

## 2019-06-08 MED ORDER — NOREPINEPHRINE 4 MG/250ML-% IV SOLN
0.0000 ug/min | INTRAVENOUS | Status: DC
  Administered 2019-06-08: 2 ug/min via INTRAVENOUS
  Filled 2019-06-08: qty 250

## 2019-06-08 MED ORDER — PRO-STAT SUGAR FREE PO LIQD
30.0000 mL | Freq: Three times a day (TID) | ORAL | Status: DC
  Administered 2019-06-09 – 2019-06-13 (×13): 30 mL
  Filled 2019-06-08 (×13): qty 30

## 2019-06-08 MED ORDER — MIDODRINE HCL 5 MG PO TABS
5.0000 mg | ORAL_TABLET | Freq: Two times a day (BID) | ORAL | Status: DC
  Administered 2019-06-09: 5 mg via ORAL
  Filled 2019-06-08: qty 1

## 2019-06-08 MED ORDER — JEVITY 1.2 CAL PO LIQD
1000.0000 mL | ORAL | Status: DC
  Filled 2019-06-08: qty 1000

## 2019-06-08 MED ORDER — OXYCODONE-ACETAMINOPHEN 5-325 MG PO TABS
1.0000 | ORAL_TABLET | Freq: Every day | ORAL | Status: DC
  Administered 2019-06-09 – 2019-06-13 (×5): 1
  Filled 2019-06-08 (×5): qty 1

## 2019-06-08 MED ORDER — CHLORHEXIDINE GLUCONATE 0.12 % MT SOLN: 15.0000 mL | Freq: Three times a day (TID) | OROMUCOSAL | Status: DC

## 2019-06-08 MED ORDER — MIDODRINE HCL 5 MG PO TABS: 2.5000 mg | ORAL_TABLET | Freq: Two times a day (BID) | ORAL | Status: DC

## 2019-06-08 MED ORDER — SODIUM BICARBONATE 650 MG PO TABS
650.0000 mg | ORAL_TABLET | Freq: Four times a day (QID) | ORAL | Status: DC
  Administered 2019-06-08 – 2019-06-10 (×7): 650 mg
  Filled 2019-06-08 (×7): qty 1

## 2019-06-08 MED ORDER — ONDANSETRON HCL 4 MG/2ML IJ SOLN
4.0000 mg | Freq: Four times a day (QID) | INTRAMUSCULAR | Status: DC | PRN
  Administered 2019-06-10: 4 mg via INTRAVENOUS
  Filled 2019-06-08: qty 2

## 2019-06-08 MED ORDER — FREE WATER
100.0000 mL | Freq: Four times a day (QID) | Status: DC
  Administered 2019-06-08 – 2019-06-10 (×6): 100 mL

## 2019-06-08 MED ORDER — INSULIN ASPART 100 UNIT/ML ~~LOC~~ SOLN: 0.0000 [IU] | Freq: Every day | SUBCUTANEOUS | Status: DC

## 2019-06-08 MED ORDER — CHLORHEXIDINE GLUCONATE 0.12% ORAL RINSE (MEDLINE KIT)
15.0000 mL | Freq: Two times a day (BID) | OROMUCOSAL | Status: DC
  Administered 2019-06-08 – 2019-06-13 (×10): 15 mL via OROMUCOSAL

## 2019-06-08 MED ORDER — FENTANYL CITRATE (PF) 100 MCG/2ML IJ SOLN
25.0000 ug | INTRAMUSCULAR | Status: DC | PRN
  Administered 2019-06-10 – 2019-06-11 (×2): 50 ug via INTRAVENOUS
  Filled 2019-06-08 (×2): qty 2

## 2019-06-08 MED ORDER — FLUDROCORTISONE ACETATE 0.1 MG PO TABS
0.1000 mg | ORAL_TABLET | ORAL | Status: DC
  Administered 2019-06-08 – 2019-06-12 (×2): 0.1 mg
  Filled 2019-06-08 (×2): qty 1

## 2019-06-08 MED ORDER — JEVITY 1.2 CAL PO LIQD
60.0000 mL/h | ORAL | Status: DC
  Filled 2019-06-08 (×4): qty 237

## 2019-06-08 MED ORDER — ONDANSETRON HCL 4 MG PO TABS: 4.0000 mg | ORAL_TABLET | Freq: Four times a day (QID) | ORAL | Status: DC | PRN

## 2019-06-08 MED ORDER — ADULT MULTIVITAMIN W/MINERALS CH
1.0000 | ORAL_TABLET | Freq: Every day | ORAL | Status: DC
  Administered 2019-06-09 – 2019-06-13 (×5): 1
  Filled 2019-06-08 (×4): qty 1

## 2019-06-08 MED ORDER — SODIUM CHLORIDE 0.9 % IV SOLN
2.0000 g | Freq: Once | INTRAVENOUS | Status: AC
  Administered 2019-06-08: 15:00:00 2 g via INTRAVENOUS
  Filled 2019-06-08: qty 2

## 2019-06-08 MED ORDER — POLYVINYL ALCOHOL 1.4 % OP SOLN
1.0000 [drp] | OPHTHALMIC | Status: DC | PRN
  Filled 2019-06-08: qty 15

## 2019-06-08 MED ORDER — METRONIDAZOLE IN NACL 5-0.79 MG/ML-% IV SOLN
500.0000 mg | Freq: Once | INTRAVENOUS | Status: AC
  Administered 2019-06-08: 15:00:00 500 mg via INTRAVENOUS
  Filled 2019-06-08: qty 100

## 2019-06-08 MED ORDER — HEPARIN SODIUM (PORCINE) 5000 UNIT/ML IJ SOLN: 5000.0000 [IU] | Freq: Three times a day (TID) | INTRAMUSCULAR | Status: DC

## 2019-06-08 MED ORDER — ACETAMINOPHEN 325 MG PO TABS: 650.0000 mg | ORAL_TABLET | ORAL | Status: DC | PRN

## 2019-06-08 MED ORDER — METRONIDAZOLE IN NACL 5-0.79 MG/ML-% IV SOLN
500.0000 mg | Freq: Three times a day (TID) | INTRAVENOUS | Status: DC
  Administered 2019-06-08 – 2019-06-09 (×2): 500 mg via INTRAVENOUS
  Filled 2019-06-08 (×3): qty 100

## 2019-06-08 MED ORDER — PANTOPRAZOLE SODIUM 40 MG PO PACK
40.0000 mg | PACK | Freq: Every day | ORAL | Status: DC
  Administered 2019-06-08 – 2019-06-13 (×6): 40 mg
  Filled 2019-06-08 (×6): qty 20

## 2019-06-08 MED ORDER — SODIUM CHLORIDE 0.9 % IV BOLUS
1000.0000 mL | Freq: Once | INTRAVENOUS | Status: AC
  Administered 2019-06-08: 1000 mL via INTRAVENOUS

## 2019-06-08 MED ORDER — FREE WATER
400.0000 mL | Freq: Four times a day (QID) | Status: DC
  Administered 2019-06-08: 20:00:00 400 mL

## 2019-06-08 MED ORDER — CHLORHEXIDINE GLUCONATE CLOTH 2 % EX PADS
6.0000 | MEDICATED_PAD | Freq: Every day | CUTANEOUS | Status: DC
  Administered 2019-06-08: 19:00:00 6 via TOPICAL

## 2019-06-08 MED ORDER — INSULIN ASPART 100 UNIT/ML ~~LOC~~ SOLN: 0.0000 [IU] | Freq: Three times a day (TID) | SUBCUTANEOUS | Status: DC

## 2019-06-08 MED ORDER — MUPIROCIN 2 % EX OINT
1.0000 "application " | TOPICAL_OINTMENT | Freq: Two times a day (BID) | CUTANEOUS | Status: AC
  Administered 2019-06-08 – 2019-06-13 (×10): 1 via NASAL
  Filled 2019-06-08 (×2): qty 22

## 2019-06-08 MED ORDER — ALBUTEROL SULFATE (2.5 MG/3ML) 0.083% IN NEBU: 2.5000 mg | INHALATION_SOLUTION | Freq: Four times a day (QID) | RESPIRATORY_TRACT | Status: DC | PRN

## 2019-06-08 MED ORDER — MIDAZOLAM HCL 2 MG/2ML IJ SOLN: 1.0000 mg | INTRAMUSCULAR | Status: DC | PRN

## 2019-06-08 MED ORDER — PANTOPRAZOLE SODIUM 40 MG IV SOLR: 40.0000 mg | Freq: Every day | INTRAVENOUS | Status: DC

## 2019-06-08 MED ORDER — VITAMIN C 500 MG PO TABS
500.0000 mg | ORAL_TABLET | Freq: Every day | ORAL | Status: DC
  Administered 2019-06-09 – 2019-06-13 (×5): 500 mg
  Filled 2019-06-08 (×5): qty 1

## 2019-06-08 MED ORDER — SODIUM CHLORIDE 0.9 % IV SOLN
2.0000 g | Freq: Two times a day (BID) | INTRAVENOUS | Status: DC
  Administered 2019-06-09: 2 g via INTRAVENOUS
  Filled 2019-06-08: qty 2

## 2019-06-08 MED ORDER — MAGNESIUM OXIDE 400 (241.3 MG) MG PO TABS
400.0000 mg | ORAL_TABLET | Freq: Two times a day (BID) | ORAL | Status: DC
  Administered 2019-06-08: 400 mg
  Filled 2019-06-08 (×2): qty 1

## 2019-06-08 MED ORDER — BACLOFEN 10 MG PO TABS
5.0000 mg | ORAL_TABLET | Freq: Three times a day (TID) | ORAL | Status: DC
  Administered 2019-06-08 – 2019-06-13 (×14): 5 mg
  Filled 2019-06-08 (×16): qty 0.5

## 2019-06-08 MED ORDER — JUVEN PO PACK
1.0000 | PACK | Freq: Two times a day (BID) | ORAL | Status: DC
  Administered 2019-06-09: 1
  Filled 2019-06-08 (×2): qty 1

## 2019-06-08 MED ORDER — VANCOMYCIN HCL 10 G IV SOLR
1500.0000 mg | Freq: Once | INTRAVENOUS | Status: AC
  Administered 2019-06-08: 16:00:00 1500 mg via INTRAVENOUS
  Filled 2019-06-08: qty 1500

## 2019-06-08 MED ORDER — VANCOMYCIN HCL IN DEXTROSE 1-5 GM/200ML-% IV SOLN
1000.0000 mg | Freq: Once | INTRAVENOUS | Status: DC
  Filled 2019-06-08: qty 200

## 2019-06-08 MED ORDER — VANCOMYCIN HCL 10 G IV SOLR
1500.0000 mg | Freq: Two times a day (BID) | INTRAVENOUS | Status: DC
  Administered 2019-06-09: 1500 mg via INTRAVENOUS
  Filled 2019-06-08 (×2): qty 1500

## 2019-06-08 MED ORDER — CLONAZEPAM 0.5 MG PO TBDP: 0.5000 mg | ORAL_TABLET | Freq: Three times a day (TID) | ORAL | Status: DC | PRN

## 2019-06-08 MED ORDER — SODIUM CHLORIDE 0.45 % IV SOLN
INTRAVENOUS | Status: DC
  Administered 2019-06-08 – 2019-06-09 (×2): via INTRAVENOUS

## 2019-06-08 MED ORDER — PRO-STAT SUGAR FREE PO LIQD
30.0000 mL | Freq: Two times a day (BID) | ORAL | Status: DC
  Administered 2019-06-08: 30 mL
  Filled 2019-06-08: qty 30

## 2019-06-08 MED ORDER — VITAL HIGH PROTEIN PO LIQD
1000.0000 mL | ORAL | Status: DC
  Administered 2019-06-08: 1000 mL

## 2019-06-08 MED ORDER — FENTANYL CITRATE (PF) 100 MCG/2ML IJ SOLN: 25.0000 ug | INTRAMUSCULAR | Status: DC | PRN

## 2019-06-08 MED ORDER — SODIUM CHLORIDE 0.9 % IV SOLN: Freq: Once | INTRAVENOUS | Status: DC

## 2019-06-08 MED ORDER — SODIUM CHLORIDE 0.9 % IV SOLN: INTRAVENOUS | Status: DC

## 2019-06-08 MED ORDER — ENOXAPARIN SODIUM 40 MG/0.4ML ~~LOC~~ SOLN
40.0000 mg | SUBCUTANEOUS | Status: DC
  Administered 2019-06-08 – 2019-06-12 (×5): 40 mg via SUBCUTANEOUS
  Filled 2019-06-08 (×5): qty 0.4

## 2019-06-08 MED ORDER — CLONAZEPAM 0.5 MG PO TABS: 0.5000 mg | ORAL_TABLET | Freq: Three times a day (TID) | ORAL | Status: DC | PRN

## 2019-06-08 MED ORDER — ORAL CARE MOUTH RINSE
15.0000 mL | OROMUCOSAL | Status: DC
  Administered 2019-06-08 – 2019-06-13 (×43): 15 mL via OROMUCOSAL

## 2019-06-08 MED ORDER — HYDROCORTISONE NA SUCCINATE PF 100 MG IJ SOLR
50.0000 mg | Freq: Four times a day (QID) | INTRAMUSCULAR | Status: DC
  Administered 2019-06-08 – 2019-06-10 (×7): 50 mg via INTRAVENOUS
  Filled 2019-06-08 (×7): qty 2

## 2019-06-08 NOTE — Sepsis Progress Note (Signed)
Notified bedside nurse of need to draw lactic acid.  

## 2019-06-08 NOTE — ED Notes (Signed)
Dr. Gilford Raid aware of pt's bp, 69/53 she stated if fluids don't bring bp up then pressers will be started. Will continue to monitor and hang 2nd liter of fluids.

## 2019-06-08 NOTE — ED Notes (Signed)
ED TO INPATIENT HANDOFF REPORT  ED Nurse Name and Phone #: Percival Spanishlana, RN [phone]  S Name/Age/Gender Barbarann Ehlersichard Garcialopez 63 y.o. male Room/Bed: 035C/035C  Code Status   Code Status: Prior  Home/SNF/Other Skilled nursing facility Patient oriented to: self Is this baseline? Yes   Triage Complete: Triage complete  Chief Complaint [facility] Patient  Triage Note [facility]. Unresponsive 20-3725mins. bp 44/25. Ems manual 100/84. Hr 96, bs 164, r-24 sa02 100% cap 41, temp 97.0. [facility] gave 500cc fluid and had him in trendelenberg. Has had previous episodes like this in the past.   Allergies Allergies  Allergen Reactions  . Contrast Media [Iodinated Diagnostic Agents] Other (See Comments)    unknown  . Iodine Other (See Comments)    unknown    Level of Care/Admitting Diagnosis ED Disposition    ED Disposition Condition Comment   Admit  The patient appears reasonably stabilized for admission considering the current resources, flow, and capabilities available in the ED at this time, and I doubt any other [facility] requiring further screening and/or treatment in the ED prior to admission is  present.       B Medical/Surgery History Past Medical History:  Diagnosis Date  . Anemia   . Chronic kidney disease   . Disorder of the autonomic nervous system, unspecified   . Dysphagia   . Encephalopathy   . Pneumonia   . Pneumonia   . Pressure ulcer of sacral region, stage 3 (HCC)   . Quadriplegia (HCC)   . Respiratory failure (HCC)   . Uninhibited neuropathic bladder, not elsewhere classified   . Urinary retention    Past Surgical History:  Procedure Laterality Date  . TRACHEOSTOMY       A IV Location/Drains/Wounds Patient Lines/Drains/Airways Status   Active Line/Drains/Airways    Name:   Placement date:   Placement time:   Site:   Days:   Peripheral IV 06/08/19 Left Wrist   06/08/19    1519    Wrist   less than 1   PICC Double Lumen 04/29/19 Left   04/29/19    -     40   Gastrostomy/Enterostomy   -    -    -      Suprapubic Catheter   -    -    -      Tracheostomy Portex 8 mm Cuffed   -    -    8 mm      Pressure Injury 05/08/19 Unstageable - Full thickness tissue loss in which the base of the ulcer is covered by slough (yellow, tan, gray, green or brown) and/or eschar (tan, brown or black) in the wound bed.   05/08/19    1900     31   Pressure Injury 05/09/19 Unstageable - Full thickness tissue loss in which the base of the ulcer is covered by slough (yellow, tan, gray, green or brown) and/or eschar (tan, brown or black) in the wound bed.   05/09/19    1600     30   Wound / Incision (Open or Dehisced) 05/08/19  Buttocks Left   05/08/19    1900    Buttocks   31          Intake/Output Last 24 hours  Intake/Output Summary (Last 24 hours) at 06/08/2019 1534 Last data filed at 06/08/2019 1534 Gross per 24 hour  Intake 100 ml  Output -  Net 100 ml    Labs/Imaging Results for orders placed or  performed during the hospital encounter of 06/08/19 (from the past 48 hour(s))  I-stat chem 8, ED (not at [facility] or [facility])     Status: Abnormal   Collection Time: 06/08/19  3:10 PM  Result Value Ref Range   Sodium 150 (H) 135 - 145 mmol/L   Potassium 4.7 3.5 - 5.1 mmol/L   Chloride 121 (H) 98 - 111 mmol/L   BUN 66 (H) 8 - 23 mg/dL   Creatinine, Ser 0.60 (L) 0.61 - 1.24 mg/dL   Glucose, Bld 118 (H) 70 - 99 mg/dL   Calcium, Ion 1.50 (H) 1.15 - 1.40 mmol/L   TCO2 24 22 - 32 mmol/L   Hemoglobin 9.2 (L) 13.0 - 17.0 g/dL   HCT 27.0 (L) 39.0 - 52.0 %   No results found.  Pending Labs Unresulted Labs (From admission, onward)    Start     Ordered   06/08/19 1456  SARS Coronavirus 2  Once,   R     06/08/19 1456   06/08/19 1420  Lactic acid, plasma  Now then every 2 hours,   STAT     06/08/19 1419   06/08/19 1420  Comprehensive metabolic panel  ONCE - STAT,   STAT     06/08/19 1419   06/08/19 1420  CBC WITH DIFFERENTIAL  ONCE - STAT,   STAT     06/08/19 1419    06/08/19 1420  Blood Culture (routine x 2)  BLOOD CULTURE X 2,   STAT     06/08/19 1419   06/08/19 1420  Urinalysis, Routine w reflex microscopic  ONCE - STAT,   STAT     06/08/19 1419          Vitals/Pain Today's Vitals   06/08/19 1417 06/08/19 1432 06/08/19 1442 06/08/19 1500  BP: (!) 88/54   (!) 69/53  Pulse: (!) 106     Resp: (!) 24   (!) 26  Temp:  (!) 96.1 F (35.6 C)    TempSrc:  Rectal    SpO2: 98%     Weight: 86.2 kg  86.2 kg   Height:   6' (1.829 m)     Isolation Precautions No active isolations  Medications Medications  metroNIDAZOLE (FLAGYL) IVPB 500 mg (500 mg Intravenous New Bag/Given 06/08/19 1505)  vancomycin (VANCOCIN) 1,500 mg in sodium chloride 0.9 % 500 mL IVPB (has no administration in time range)  ceFEPIme (MAXIPIME) 2 g in sodium chloride 0.9 % 100 mL IVPB (0 g Intravenous Stopped 06/08/19 1534)  sodium chloride 0.9 % bolus 1,000 mL (1,000 mLs Intravenous New Bag/Given 06/08/19 1506)  sodium chloride 0.9 % bolus 1,000 mL (1,000 mLs Intravenous New Bag/Given 06/08/19 1528)    Mobility non-ambulatory High fall risk   Focused Assessments Septic work-up (urosepsis)   R Recommendations: See Admitting Provider Note  Report given to:   Additional Notes:

## 2019-06-08 NOTE — Progress Notes (Signed)
[place] Progress Note Patient Name: Lenton Gendreau DOB: November 19, 1956 MRN: [id number]   Date of Service  06/08/2019  HPI/Events of Note  Lactic Acid = 2.8 --> 3.9.  eICU Interventions  Will order: 1. Monitor CVP. 2. Bolus with 0.9 NaCl 1 liter IV over 1 hour now.      Intervention Category Major Interventions: Acid-Base disturbance - evaluation and management  Miachel Nardelli Eugene 06/08/2019, 10:14 PM

## 2019-06-08 NOTE — ED Triage Notes (Signed)
[facility] hosp. Unresponsive 20-65mins. bp 44/25. Ems manual 100/84. Hr 96, bs 164, r-24 sa02 100% cap 41, temp 97.0. [facility] gave 500cc fluid and had him in trendelenberg. Has had previous episodes like this in the past.

## 2019-06-08 NOTE — ED Notes (Signed)
Pt had BM and I cleaned peroneal area and applied clean pad under pt.  Pt was placed on Bair hugger due to hypothermic temp. Pt has unstageable pressure ulcer in sacral/coccyx area.  Pt's mouth was suctioned.

## 2019-06-08 NOTE — ED Notes (Addendum)
Rahul, PA at bedside and aware of pts bp of 53/42. Currently contacting family to discuss further plan of care.

## 2019-06-08 NOTE — ED Notes (Addendum)
Heidi sister: [phone], spoke to this RN for an update, will give admitting team her number.

## 2019-06-08 NOTE — Progress Notes (Signed)
CRITICAL VALUE ALERT  Critical Value:  Lactic 3.9  Date & Time Notied:  06/08/19 2213  Provider Notified: Oletta Darter E link  Orders Received/Actions taken: New orders received.

## 2019-06-08 NOTE — Progress Notes (Signed)
Pharmacy Antibiotic Note  Francis Osborne is a 63 y.o. male admitted on 06/08/2019 with sepsis.  Pharmacy has been consulted for vancomycin and cefepime dosing. Pt is afebrile and WBC is WNL. SCr is 0.66 which is slightly above patients baseline. Lactic acid is also elevated.   Plan: Vancomycin 1500mg  IV Q12H Cefepime 2gm IV Q12H F/u renal fxn, C&S, clinical status and peak/trough at SS  Weight: 190 lb (86.2 kg)  Temp (24hrs), Avg:96.1 F (35.6 C), Min:96.1 F (35.6 C), Max:96.1 F (35.6 C)  No results for input(s): WBC, CREATININE, LATICACIDVEN, VANCOTROUGH, VANCOPEAK, VANCORANDOM, GENTTROUGH, GENTPEAK, GENTRANDOM, TOBRATROUGH, TOBRAPEAK, TOBRARND, AMIKACINPEAK, AMIKACINTROU, AMIKACIN in the last 168 hours.  CrCl cannot be calculated (Patient's most recent lab result is older than the maximum 21 days allowed.).    Allergies  Allergen Reactions  . Contrast Media [Iodinated Diagnostic Agents] Other (See Comments)    unknown  . Iodine Other (See Comments)    unknown    Antimicrobials this admission: Vanc 6/18>> Cefepime 6/18>> Flagyl x 1 6/18  Dose adjustments this admission: N/A  Microbiology results: Pending  Thank you for allowing pharmacy to be a part of this patient's care.  Jacquese Cassarino, Rande Lawman 06/08/2019 2:42 PM

## 2019-06-08 NOTE — ED Notes (Signed)
Report given to Kylie RN
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
## 2019-06-08 NOTE — H&P (Signed)
[facility]                                                                                    Patient Demographics  Sabrina Arriaga, is a 63 y.o. male  CSN: [id number]  MRN: [id number]  DOB - 08-Feb-1956  Admit Date - 06/08/2019  Outpatient Primary MD for the patient is Townsend Roger, MD   With History of -  Past Medical History:  Diagnosis Date  . Anemia   . Chronic kidney disease   . Disorder of the autonomic nervous system, unspecified   . Dysphagia   . Encephalopathy   . Pneumonia   . Pneumonia   . Pressure ulcer of sacral region, stage 3 ([place])   . Quadriplegia ([place])   . Respiratory failure ([place])   . Uninhibited neuropathic bladder, not elsewhere classified   . Urinary retention       Past Surgical History:  Procedure Laterality Date  . TRACHEOSTOMY      in for   Chief Complaint  Patient presents with  . Loss of Consciousness     HPI  Jaydan Meidinger  is a 63 y.o. male, with past medical history significant for vent dependent chronic respiratory failure, PCM and malnutrition who was sent from his LTAC for evaluation for hypotension and probable sepsis. She was recently admitted to our facility on 5/18 for bilateral pneumonia and was sent back to his LTAC after he was stabilized on ATC during the day and vent nightly.  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 Patient could not give any history In the emergency room his COVID-19 was negative and he was found to have left-sided pneumonia and worsening unstageable sacral decubitus ulcer. He is completely vent dependent at this time, unresponsive with elevated lactic acid. PCCM consulted on the patient in the emergency room and will follow patient with [place].    Review of Systems    Unable to obtain due to patient's condition  Social History Social History   Tobacco Use  . Smoking status: Never Smoker  . Smokeless tobacco: Never Used  Substance Use Topics  . Alcohol  use: Not Currently     Family History Unable to obtain due to patient's condition  Prior to Admission medications   Medication Sig Start Date End Date Taking? Authorizing Provider  acetaminophen (TYLENOL) 325 MG tablet Place 650 mg into feeding tube every 4 (four) hours as needed for mild pain, moderate pain or headache.   Yes [provider]  albuterol (PROVENTIL) (2.5 MG/3ML) 0.083% nebulizer solution Take 3 mLs (2.5 mg total) by nebulization every 3 (three) hours as needed for wheezing or shortness of breath. 05/16/19  Yes Hosie Poisson, MD  Amino Acids-Protein Hydrolys (FEEDING SUPPLEMENT, PRO-STAT SUGAR FREE 64,) LIQD Place 30 mLs into feeding tube 3 (three) times daily with meals.   Yes [provider]  Baclofen 5 MG TABS Place 5 mg into feeding tube 3 (three) times daily.   Yes [provider]  chlorhexidine (PERIDEX) 0.12 % solution Use as directed 15 mLs in the mouth or throat See admin instructions. By shift   Yes [provider]  clonazePAM (KLONOPIN) 0.5 MG tablet Place 1 tablet (0.5 mg total) into feeding tube every 12 (twelve) hours as needed for anxiety. 05/16/19  Yes Kathlen ModyAkula, Vijaya, MD  esomeprazole (NEXIUM) 40 MG packet Take 20 mg by mouth daily before breakfast.   Yes [provider]  fludrocortisone (FLORINEF) 0.1 MG tablet Place 0.1 mg into feeding tube 2 (two) times a week. Monday and Thursday   Yes [provider]  hydrALAZINE (APRESOLINE) 20 MG/ML injection Inject into the vein every 4 (four) hours as needed (Systolic blood pressure greater than). 0.95 ml   Yes [provider]  loperamide HCl (IMODIUM) 1 MG/7.5ML suspension Place 30 mLs (4 mg total) into feeding tube as needed for diarrhea or loose stools. 05/16/19  Yes Kathlen ModyAkula, Vijaya, MD  loratadine (CLARITIN) 5 MG/5ML syrup Take 10 mLs (10 mg total) by mouth daily as needed for allergies or rhinitis. 05/16/19  Yes Kathlen ModyAkula, Vijaya, MD  magnesium oxide (MAG-OX) 400 MG  tablet Place 400 mg into feeding tube 2 (two) times daily.   Yes [provider]  midodrine (PROAMATINE) 2.5 MG tablet Place 2.5 mg into feeding tube 2 (two) times daily with a meal.   Yes [provider]  Multiple Vitamins-Minerals (MULTIVITAMIN ADULT PO) 1 tablet by Gastric Tube route daily.   Yes [provider]  nutrition supplement, JUVEN, (JUVEN) PACK Place 1 packet into feeding tube 2 (two) times daily between meals. 05/16/19  Yes Kathlen ModyAkula, Vijaya, MD  Nutritional Supplements (ISOSOURCE 1.5 CAL) LIQD Take 60 mL/hr by mouth continuous.   Yes [provider]  oxyCODONE-acetaminophen (PERCOCET/ROXICET) 5-325 MG tablet Place 1 tablet into feeding tube daily. Patient taking differently: Place 1 tablet into feeding tube every 12 (twelve) hours.  05/16/19  Yes Kathlen ModyAkula, Vijaya, MD  polyvinyl alcohol (LIQUIFILM TEARS) 1.4 % ophthalmic solution Place 1 drop into both eyes as needed for dry eyes. 05/16/19  Yes Kathlen ModyAkula, Vijaya, MD  sodium bicarbonate 325 MG tablet Place 650 mg into feeding tube 4 (four) times daily.   Yes [provider]  vitamin C (ASCORBIC ACID) 500 MG tablet Place 500 mg into feeding tube daily.   Yes [provider]  Water For Irrigation, Sterile (FREE WATER) SOLN Place 200 mLs into feeding tube every 8 (eight) hours. 05/16/19  Yes Kathlen ModyAkula, Vijaya, MD  Chlorhexidine Gluconate Cloth 2 % PADS Apply 6 each topically daily. Patient not taking: Reported on 06/08/2019 05/17/19   Kathlen ModyAkula, Vijaya, MD  Maltodextrin-Xanthan Gum (RESOURCE [facility] CLEAR) POWD As recommended. Patient not taking: Reported on 06/08/2019 05/16/19   Kathlen ModyAkula, Vijaya, MD  Nutritional Supplements (FEEDING SUPPLEMENT, JEVITY 1.2 CAL,) LIQD Place 1,000 mLs into feeding tube continuous. Patient not taking: Reported on 06/08/2019 05/16/19   Kathlen ModyAkula, Vijaya, MD  pantoprazole sodium (PROTONIX) 40 mg/20 mL PACK Place 20 mLs (40 mg total) into feeding tube daily. Patient not taking: Reported on  06/08/2019 05/17/19   Kathlen ModyAkula, Vijaya, MD    Allergies  Allergen Reactions  . Contrast Media [Iodinated Diagnostic Agents] Other (See Comments)    unknown  . Iodine Other (See Comments)    unknown    Physical Exam  Vitals  Blood pressure (!) 60/45, pulse 90, temperature 97.9 F (36.6 C), temperature source Rectal, resp. rate (!) 22, height 6' (1.829 m), weight 86.2 kg, SpO2 94 %.   1. General obtunded, comatose  2.  Psychologically, unable to evaluate.  3.  Neurologically, unable to evaluate due to patient's condition , patient is comatose.  4.  HEENT: No facial deviation noted, no oral thrush.  5. Supple Neck, tracheostomy in midline.  6. Symmetrical Chest wall movement, decreased breath sounds with mild scattered rhonchi bilaterally  7. RRR, No Gallops, Rubs or Murmurs, No Parasternal Heave.  8. Positive Bowel Sounds, Abdomen Soft, Non tender, PEG in place, suprapubic catheter in place.  9.  No Cyanosis, Normal Skin Turgor, decreased muscle mass.  10.  Decreased turgor and sacral decubitus ulcer noted, unstageable.    Data Review  CBC Recent Labs  Lab 06/08/19 1447 06/08/19 1510  WBC 5.6  --   HGB 8.5* 9.2*  HCT 30.3* 27.0*  PLT PLATELET CLUMPS NOTED ON SMEAR, UNABLE TO ESTIMATE  --   MCV 109.0*  --   MCH 30.6  --   MCHC 28.1*  --   RDW 18.6*  --   LYMPHSABS 0.6*  --   MONOABS 0.2  --   EOSABS 0.2  --   BASOSABS 0.0  --    ------------------------------------------------------------------------------------------------------------------  Chemistries  Recent Labs  Lab 06/08/19 1447 06/08/19 1510  NA 149* 150*  K 5.0 4.7  CL 119* 121*  CO2 22  --   GLUCOSE 122* 118*  BUN 59* 66*  CREATININE 0.66 0.60*  CALCIUM 10.5*  --   AST 70*  --   ALT 58*  --   ALKPHOS 242*  --   BILITOT 1.8*  --    ------------------------------------------------------------------------------------------------------------------ estimated creatinine clearance is 105.1  mL/min (A) (by C-G formula based on SCr of 0.6 mg/dL (L)). ------------------------------------------------------------------------------------------------------------------ No results for input(s): TSH, T4TOTAL, T3FREE, THYROIDAB in the last 72 hours.  Invalid input(s): FREET3   Coagulation profile No results for input(s): INR, PROTIME in the last 168 hours. ------------------------------------------------------------------------------------------------------------------- No results for input(s): DDIMER in the last 72 hours. -------------------------------------------------------------------------------------------------------------------  Cardiac Enzymes No results for input(s): CKMB, TROPONINI, MYOGLOBIN in the last 168 hours.  Invalid input(s): CK ------------------------------------------------------------------------------------------------------------------ Invalid input(s): POCBNP   ---------------------------------------------------------------------------------------------------------------  Urinalysis    Component Value Date/Time   COLORURINE YELLOW 06/08/2019 1447   APPEARANCEUR CLOUDY (A) 06/08/2019 1447   LABSPEC <1.005 (L) 06/08/2019 1447   PHURINE >9.0 (H) 06/08/2019 1447   GLUCOSEU NEGATIVE 06/08/2019 1447   HGBUR SMALL (A) 06/08/2019 1447   BILIRUBINUR NEGATIVE 06/08/2019 1447   KETONESUR NEGATIVE 06/08/2019 1447   PROTEINUR 100 (A) 06/08/2019 1447   NITRITE NEGATIVE 06/08/2019 1447   LEUKOCYTESUR LARGE (A) 06/08/2019 1447    ----------------------------------------------------------------------------------------------------------------   Imaging results:   Dg Chest Port 1 View  Result Date: 06/08/2019 CLINICAL DATA:  Found unresponsive today, blood pressure lower than normal. EXAM: PORTABLE CHEST 1 VIEW COMPARISON:  Chest x-rays dated 05/11/2019, 05/08/2019 and 04/29/2019. FINDINGS: Heart size and mediastinal contours are stable. Tracheostomy tube in  the midline. LEFT-sided PICC line appears adequately positioned with tip at the level of the mid/upper SVC. Increasing ill-defined opacity within the LEFT mid and lower lung zones. RIGHT lung is clear. No pneumothorax seen. IMPRESSION: Increasing ill-defined opacity within the LEFT mid and lower lung zones, pneumonia versus asymmetric edema, less likely layering effusion. Electronically Signed   By: Bary RichardStan  Maynard M.D.   On: 06/08/2019 16:09   Dg Chest Port 1 View  Result Date: 05/11/2019 CLINICAL DATA:  Respiratory failure EXAM: PORTABLE CHEST 1 VIEW COMPARISON:  05/08/2019 FINDINGS: Cardiac shadow is stable. Left-sided PICC line and tracheostomy tube are noted in satisfactory position. Left basilar infiltrate is noted slightly progressed in the interval from the prior exam. No new focal infiltrate is seen. No  bony abnormality is noted. IMPRESSION: Slight increase in left basilar infiltrate. Electronically Signed   By: Alcide CleverMark  Lukens M.D.   On: 05/11/2019 07:49    My personal review of EKG: Rhythm NSR, 92 bpm, no acute changes Personally reviewed Old Chart from 05/07/2018  Assessment & Plan  Sepsis, POA Source is multifactorial Start broad-spectrum antibiotics, vancomycin, cefepime and Flagyl  Bilateral pneumonia Continue with IV antibiotics  Unstageable sacral decubitus ulcer, POA Wound care, IV antibiotics  VDRF, chronic hypoxic hypercarbic Mechanical ventilation followed by PCCM  Chronic colonization with CRE  Dysphagia/malnutrition Continue with tube feeds per PEG  Encephalopathy, chronic Monitor neuro  Urinary retention status post suprapubic catheter  Hypernatremia We will give half-normal saline and increase free water flushes to 100 mL/h  Hypotension Chronic Start Levophed  CKD Stage I  DVT Prophylaxis Lovenox  AM Labs Ordered, also please review Full Orders  Family Communication: Discussed with Dell Pontorthur Defino his brother on the phone [phone]   Code Status  full  Disposition Plan: Back to [facility]  Time spent in minutes : 45 minutes  Condition critical   @SIGNATURE @

## 2019-06-08 NOTE — ED Notes (Addendum)
Pt had a BM and we cleaned peroneal area and applied a clean pad. Noted pt has a stage 3 pressure ulcer on left buttock. Suctioned pt's mouth several times while in there changing pt.

## 2019-06-08 NOTE — ED Notes (Signed)
Pt's mouth was suctioned.

## 2019-06-08 NOTE — Progress Notes (Signed)
[place] Progress Note Patient Name: Francis Osborne DOB: [birth year] MRN: [id number]   Date of Service  06/08/2019  HPI/Events of Note  Multiple conflicting medication orders: two different IV fluids, two different enteral feeds, Protonix ordered twice. Midodrine ordered twice etc.   eICU Interventions  Medication order confusion clarified.      Intervention Category Major Interventions: Other:  Lysle Dingwall 06/08/2019, 7:36 PM

## 2019-06-08 NOTE — ED Notes (Signed)
Notified Dr. Leonidas Romberg of pt's bp of 61/46, she stated to hang another liter of ns. I will keep monitoring pt.

## 2019-06-08 NOTE — ED Provider Notes (Addendum)
Pt signed out by Dr. Darl Householder pending labs and xrays.  The pt is trach and peg dependent and lives at [facility].  Today, pt had an episode of his bp dropping into the 40s and he became unresponsive.  He did become responsive after IVFs.  Pt's BP now in the 90s after 2L IVFs.  CXR:    IMPRESSION:  Increasing ill-defined opacity within the LEFT mid and lower lung  zones, pneumonia versus asymmetric edema, less likely layering  effusion.     Labs show a UTI, hypernatremia and elevated bun, anemia, and elevated lfts. Lactic is also elevated.  Anemia is chronic.  Pt given IVFs and IV abx.  Pt d/w Dr. Laren Everts ([facility]) who will admit.  I also spoke with CCM who will manage the trach and vent.  covid neg.  Pt's brother updated.  CRITICAL CARE Performed by: Isla Pence   Total critical care time: 45 minutes  Critical care time was exclusive of separately billable procedures and treating other patients.  Critical care was necessary to treat or prevent imminent or life-threatening deterioration.  Critical care was time spent personally by me on the following activities: development of treatment plan with patient and/or surrogate as well as nursing, discussions with consultants, evaluation of patient's response to treatment, examination of patient, obtaining history from patient or surrogate, ordering and performing treatments and interventions, ordering and review of laboratory studies, ordering and review of radiographic studies, pulse oximetry and re-evaluation of patient's condition.  Eliseo Gum was evaluated in Emergency Department on 06/08/2019 for the symptoms described in the history of present illness. He was evaluated in the context of the global COVID-19 pandemic, which necessitated consideration that the patient might be at risk for infection with the SARS-CoV-2 virus that causes COVID-19. Institutional protocols and algorithms that pertain to the evaluation of patients at risk for COVID-19  are in a state of rapid change based on information released by regulatory bodies including the CDC and federal and state organizations. These policies and algorithms were followed during the patient's care in the ED.   Isla Pence, MD 06/08/19 1656    Isla Pence, MD 06/08/19 [phone]

## 2019-06-09 ENCOUNTER — Inpatient Hospital Stay (HOSPITAL_COMMUNITY): Payer: Medicare Other

## 2019-06-09 DIAGNOSIS — J9601 Acute respiratory failure with hypoxia: Secondary | ICD-10-CM

## 2019-06-09 DIAGNOSIS — L89159 Pressure ulcer of sacral region, unspecified stage: Secondary | ICD-10-CM

## 2019-06-09 LAB — CBC WITH DIFFERENTIAL/PLATELET
Abs Immature Granulocytes: 0.21 10*3/uL — ABNORMAL HIGH (ref 0.00–0.07)
Basophils Absolute: 0 10*3/uL (ref 0.0–0.1)
Basophils Relative: 0 %
Eosinophils Absolute: 0 10*3/uL (ref 0.0–0.5)
Eosinophils Relative: 0 %
HCT: 24.7 % — ABNORMAL LOW (ref 39.0–52.0)
Hemoglobin: 7.1 g/dL — ABNORMAL LOW (ref 13.0–17.0)
Immature Granulocytes: 3 %
Lymphocytes Relative: 4 %
Lymphs Abs: 0.3 10*3/uL — ABNORMAL LOW (ref 0.7–4.0)
MCH: 30.3 pg (ref 26.0–34.0)
MCHC: 28.7 g/dL — ABNORMAL LOW (ref 30.0–36.0)
MCV: 105.6 fL — ABNORMAL HIGH (ref 80.0–100.0)
Monocytes Absolute: 0.5 10*3/uL (ref 0.1–1.0)
Monocytes Relative: 6 %
Neutro Abs: 7.2 10*3/uL (ref 1.7–7.7)
Neutrophils Relative %: 87 %
Platelets: 75 10*3/uL — ABNORMAL LOW (ref 150–400)
RBC: 2.34 MIL/uL — ABNORMAL LOW (ref 4.22–5.81)
RDW: 19 % — ABNORMAL HIGH (ref 11.5–15.5)
WBC Morphology: INCREASED
WBC: 8.2 10*3/uL (ref 4.0–10.5)
nRBC: 1 % — ABNORMAL HIGH (ref 0.0–0.2)

## 2019-06-09 LAB — COMPREHENSIVE METABOLIC PANEL
ALT: 62 U/L — ABNORMAL HIGH (ref 0–44)
AST: 71 U/L — ABNORMAL HIGH (ref 15–41)
Albumin: 1.5 g/dL — ABNORMAL LOW (ref 3.5–5.0)
Alkaline Phosphatase: 192 U/L — ABNORMAL HIGH (ref 38–126)
Anion gap: 9 (ref 5–15)
BUN: 50 mg/dL — ABNORMAL HIGH (ref 8–23)
CO2: 17 mmol/L — ABNORMAL LOW (ref 22–32)
Calcium: 9.5 mg/dL (ref 8.9–10.3)
Chloride: 124 mmol/L — ABNORMAL HIGH (ref 98–111)
Creatinine, Ser: 0.72 mg/dL (ref 0.61–1.24)
GFR calc Af Amer: >60 mL/min (ref >60)
GFR calc non Af Amer: >60 mL/min (ref >60)
Glucose, Bld: 169 mg/dL — ABNORMAL HIGH (ref 70–99)
Potassium: 4.3 mmol/L (ref 3.5–5.1)
Sodium: 150 mmol/L — ABNORMAL HIGH (ref 135–145)
Total Bilirubin: 2 mg/dL — ABNORMAL HIGH (ref 0.3–1.2)
Total Protein: 6.1 g/dL — ABNORMAL LOW (ref 6.5–8.1)

## 2019-06-09 LAB — BLOOD CULTURE ID PANEL (REFLEXED)

## 2019-06-09 LAB — GLUCOSE, CAPILLARY
Glucose-Capillary: 101 mg/dL — ABNORMAL HIGH (ref 70–99)
Glucose-Capillary: 132 mg/dL — ABNORMAL HIGH (ref 70–99)
Glucose-Capillary: 164 mg/dL — ABNORMAL HIGH (ref 70–99)
Glucose-Capillary: 194 mg/dL — ABNORMAL HIGH (ref 70–99)
Glucose-Capillary: 227 mg/dL — ABNORMAL HIGH (ref 70–99)
Glucose-Capillary: 254 mg/dL — ABNORMAL HIGH (ref 70–99)
Glucose-Capillary: 265 mg/dL — ABNORMAL HIGH (ref 70–99)

## 2019-06-09 LAB — PHOSPHORUS
Phosphorus: 2.2 mg/dL — ABNORMAL LOW (ref 2.5–4.6)
Phosphorus: 2.2 mg/dL — ABNORMAL LOW (ref 2.5–4.6)

## 2019-06-09 LAB — MAGNESIUM
Magnesium: 1.9 mg/dL (ref 1.7–2.4)
Magnesium: 2.4 mg/dL (ref 1.7–2.4)

## 2019-06-09 LAB — LACTIC ACID, PLASMA: Lactic Acid, Venous: 1.9 mmol/L (ref 0.5–1.9)

## 2019-06-09 MED ORDER — MIDODRINE HCL 5 MG PO TABS
5.0000 mg | ORAL_TABLET | Freq: Three times a day (TID) | ORAL | Status: DC
  Administered 2019-06-09 – 2019-06-13 (×11): 5 mg via ORAL
  Filled 2019-06-09 (×11): qty 1

## 2019-06-09 MED ORDER — OSMOLITE 1.2 CAL PO LIQD
1000.0000 mL | ORAL | Status: DC
  Administered 2019-06-09 – 2019-06-12 (×4): 1000 mL
  Filled 2019-06-09 (×9): qty 1000

## 2019-06-09 MED ORDER — INSULIN ASPART 100 UNIT/ML ~~LOC~~ SOLN
0.0000 [IU] | SUBCUTANEOUS | Status: DC
  Administered 2019-06-09 (×2): 8 [IU] via SUBCUTANEOUS
  Administered 2019-06-10 (×2): 5 [IU] via SUBCUTANEOUS
  Administered 2019-06-10: 8 [IU] via SUBCUTANEOUS
  Administered 2019-06-10: 2 [IU] via SUBCUTANEOUS
  Administered 2019-06-10: 5 [IU] via SUBCUTANEOUS
  Administered 2019-06-11 – 2019-06-13 (×9): 2 [IU] via SUBCUTANEOUS

## 2019-06-09 MED ORDER — CHLORHEXIDINE GLUCONATE CLOTH 2 % EX PADS: 6.0000 | MEDICATED_PAD | Freq: Every day | CUTANEOUS | Status: DC

## 2019-06-09 MED ORDER — OSMOLITE 1.5 CAL PO LIQD: 1000.0000 mL | ORAL | Status: DC

## 2019-06-09 MED ORDER — COLLAGENASE 250 UNIT/GM EX OINT
TOPICAL_OINTMENT | Freq: Every day | CUTANEOUS | Status: DC
  Administered 2019-06-09 – 2019-06-10 (×2): via TOPICAL
  Administered 2019-06-11: 1 via TOPICAL
  Administered 2019-06-12: 23:00:00 via TOPICAL
  Filled 2019-06-09 (×2): qty 30

## 2019-06-09 MED ORDER — MAGNESIUM SULFATE 2 GM/50ML IV SOLN
2.0000 g | Freq: Once | INTRAVENOUS | Status: AC
  Administered 2019-06-09: 2 g via INTRAVENOUS
  Filled 2019-06-09: qty 50

## 2019-06-09 MED ORDER — SODIUM CHLORIDE 0.9 % IV SOLN
2.0000 g | Freq: Three times a day (TID) | INTRAVENOUS | Status: DC
  Administered 2019-06-09 – 2019-06-13 (×12): 2 g via INTRAVENOUS
  Filled 2019-06-09 (×12): qty 2

## 2019-06-09 MED ORDER — JEVITY 1.2 CAL PO LIQD
1000.0000 mL | ORAL | Status: DC
  Filled 2019-06-09: qty 1000

## 2019-06-09 MED ORDER — POTASSIUM PHOSPHATES 15 MMOLE/5ML IV SOLN
10.0000 mmol | Freq: Once | INTRAVENOUS | Status: AC
  Administered 2019-06-09: 10 mmol via INTRAVENOUS
  Filled 2019-06-09: qty 3.33

## 2019-06-09 MED ORDER — INSULIN ASPART 100 UNIT/ML ~~LOC~~ SOLN
0.0000 [IU] | Freq: Three times a day (TID) | SUBCUTANEOUS | Status: DC
  Administered 2019-06-09: 5 [IU] via SUBCUTANEOUS

## 2019-06-09 NOTE — Progress Notes (Signed)
Called and updated brother.
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
## 2019-06-09 NOTE — Progress Notes (Signed)
PHARMACY - PHYSICIAN COMMUNICATION CRITICAL VALUE ALERT - BLOOD CULTURE IDENTIFICATION (BCID)  Francis Osborne is an 63 y.o. male who presented to [facility] on 06/08/2019 with a chief complaint of sepsis  Assessment:  1/4 anaerobic bottle only with GNR identified as proteus species (KPC not detected). Also with a history of Klebsiella and pseudomonas.   Name of physician (or Provider) Contacted: Eric Form, NP  Current antibiotics: vancomycin, metronidazole, and cefepime  Changes to prescribed antibiotics recommended: Discontinue vancomycin and metronidazole. Continue cefepime with BCID report and prior pseudomonas. Sputum cultures will be ordered to follow.  Accepted by provider  Results for orders placed or performed during the hospital encounter of 06/08/19  Blood Culture ID Panel (Reflexed) (Collected: 06/08/2019  2:50 PM)  Result Value Ref Range   Enterococcus species NOT DETECTED NOT DETECTED   Listeria monocytogenes NOT DETECTED NOT DETECTED   Staphylococcus species NOT DETECTED NOT DETECTED   Staphylococcus aureus (BCID) NOT DETECTED NOT DETECTED   Streptococcus species NOT DETECTED NOT DETECTED   Streptococcus agalactiae NOT DETECTED NOT DETECTED   Streptococcus pneumoniae NOT DETECTED NOT DETECTED   Streptococcus pyogenes NOT DETECTED NOT DETECTED   Acinetobacter baumannii NOT DETECTED NOT DETECTED   Enterobacteriaceae species DETECTED (A) NOT DETECTED   Enterobacter cloacae complex NOT DETECTED NOT DETECTED   Escherichia coli NOT DETECTED NOT DETECTED   Klebsiella oxytoca NOT DETECTED NOT DETECTED   Klebsiella pneumoniae NOT DETECTED NOT DETECTED   Proteus species DETECTED (A) NOT DETECTED   Serratia marcescens NOT DETECTED NOT DETECTED   Carbapenem resistance NOT DETECTED NOT DETECTED   Haemophilus influenzae NOT DETECTED NOT DETECTED   Neisseria meningitidis NOT DETECTED NOT DETECTED   Pseudomonas aeruginosa NOT DETECTED NOT DETECTED   Candida albicans NOT DETECTED  NOT DETECTED   Candida glabrata NOT DETECTED NOT DETECTED   Candida krusei NOT DETECTED NOT DETECTED   Candida parapsilosis NOT DETECTED NOT DETECTED   Candida tropicalis NOT DETECTED NOT DETECTED   Vertis Kelch, PharmD PGY1 Pharmacy Resident Phone [phone] 06/09/2019       8:40 AM

## 2019-06-09 NOTE — Consult Note (Addendum)
[facility] Nurse wound consult note Reason for Consult: Consult requested for sacrum, left hip, and left buttocks chronic pressure injuries.  Pt is familiar to [facility] team from previous admission, refer to consult note on 5/19.  He is frequently incontinent of loose stools and it is difficult to keep the wounds from becoming soiled.  Wound type: Left hip with chronic unstageable pressure injury; 3X3cm, 100% dry yellow slough/eschar, small amt tan drainage, no odor, drainage, or fluctuance Sacrum with chronic unstageable pressure injury; 4X4X.8cm, 100% dry yellow slough/eschar, no odor, drainage or fluctuance Left buttock with chronic unstageable pressure injury; 4X2X.8cm, 80% dry yellow slough, 20% red, small amt tan drainage, no odor or fluctuance  Pressure Injury POA: Yes Dressing procedure/placement/frequency:  Pt is critically ill with multiple systemic factors which can impair healing.  He is on a low air loss bed to reduce pressure.  Santyl ointment to provide enzymatic debridement of nonviable tissue.   Please re-consult if further assistance is needed.  Thank-you,  Julien Girt MSN, [place], [place], [place], [place]
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
## 2019-06-09 NOTE — TOC Initial Note (Signed)
Transition of Care [facility](TOC) - Initial/Assessment Note    Patient Details  Name: Francis Osborne MRN: [id number] Date of Birth: 1956/07/20  Transition of Care [facility](TOC) CM/SW Contact:    Inis Sizerarol L Lajarvis Italiano, LCSW Phone Number: 06/09/2019, 11:35 AM  Clinical Narrative:                 CSW spoke with patient's brother Francis Osborne to discuss current goals of care. Francis Osborne states that he does not want his brother to return to [facility] after this admission. CSW familiar with Francis's desire for patient not to return to [facility] due to his prior reports of them not providing good care for his brother. Francis Osborne states that since Francis Osborne returned to [facility] last time, there has been on going problems. Francis Osborne reports that [facility] does not attempt to wean Markian off the vent at all, and that the staff at [facility] "works miracles" for his brother. Francis Osborne reports that he is not the POA and that the final decision is up to [place]. Francis Osborne desires either a different placement for his brother or for him to be weaned off the vent here at [facility] for a discharge home. Francis Osborne reports that the family will arrange for 24 hour care for Gerlene BurdockRichard if he is able to come home. The patient's family [place] in [place], [place].   CSW will continue following for discharge planning.  Expected Discharge Plan: Skilled Nursing Facility     Patient Goals and CMS Choice   CMS Medicare.gov Compare Post Acute Care list provided to:: Dell Ponto(Francis Osborne)    Expected Discharge Plan and Services Expected Discharge Plan: Skilled Nursing Facility       Living arrangements for the past 2 months: Skilled Nursing Facility                                      Prior Living Arrangements/Services Living arrangements for the past 2 months: Skilled Nursing Facility Lives with:: Self Patient language and need for interpreter reviewed:: No        Need for Family Participation in Patient Care: Yes (Comment) Care giver  support system in place?: Yes (comment)   Criminal Activity/Legal Involvement Pertinent to Current Situation/Hospitalization: No - Comment as needed  Activities of Daily Living      Permission Sought/Granted Permission sought to share information with : Family Supports Permission granted to share information with : Yes, Verbal Permission Granted  Share Information with NAME: Dell Pontorthur Suminski     Permission granted to share info w Relationship: Brother  Permission granted to share info w Contact Information: [phone]  Emotional Assessment       Orientation: : Oriented to Self, Oriented to Place Alcohol / Substance Use: Not Applicable Psych Involvement: No (comment)  Admission diagnosis:  Dehydration [E86.0] Hypernatremia [E87.0] Ventilator dependent (HCC) [Z99.11] Hypotension, unspecified hypotension type [I95.9] Acute hypoxemic respiratory failure (HCC) [J96.01] Multifocal pneumonia [J18.9] Urinary tract infection associated with cystostomy catheter, initial encounter (HCC) [T83.510A, N39.0] Pressure injury of skin of sacral region, unspecified injury stage [L89.159] Sepsis, due to unspecified organism, unspecified whether acute organ dysfunction present (HCC) [A41.9] Covid-19 Virus not Detected [IMO0001] Septic shock (HCC) [A41.9, R65.21] Patient Active Problem List   Diagnosis Date Noted  . Sepsis (HCC) 06/08/2019  . Septic shock (HCC) 06/08/2019  . Pressure ulcer of sacral region, stage 3 (HCC)   . Pressure injury of skin 05/10/2019  .  Multifocal pneumonia 05/08/2019  . Tracheostomy tube present ([place]) 05/08/2019  . Quadriplegia ([place]) 05/08/2019  . S/P percutaneous endoscopic gastrostomy (PEG) tube placement ([place]) 05/08/2019  . Type 2 diabetes mellitus without complication ([place]) 09/31/1216   PCP:  Townsend Roger, MD Pharmacy:  No Pharmacies Listed    Social Determinants of Health (SDOH) Interventions    Readmission Risk Interventions No flowsheet data  found.

## 2019-06-10 ENCOUNTER — Other Ambulatory Visit: Payer: Self-pay

## 2019-06-10 ENCOUNTER — Inpatient Hospital Stay (HOSPITAL_COMMUNITY): Payer: Medicare Other

## 2019-06-10 DIAGNOSIS — J9621 Acute and chronic respiratory failure with hypoxia: Secondary | ICD-10-CM

## 2019-06-10 LAB — CBC
HCT: 23.2 % — ABNORMAL LOW (ref 39.0–52.0)
Hemoglobin: 6.4 g/dL — CL (ref 13.0–17.0)
MCH: 29.8 pg (ref 26.0–34.0)
MCHC: 27.6 g/dL — ABNORMAL LOW (ref 30.0–36.0)
MCV: 107.9 fL — ABNORMAL HIGH (ref 80.0–100.0)
Platelets: 80 10*3/uL — ABNORMAL LOW (ref 150–400)
RBC: 2.15 MIL/uL — ABNORMAL LOW (ref 4.22–5.81)
RDW: 18.9 % — ABNORMAL HIGH (ref 11.5–15.5)
WBC: 5.7 10*3/uL (ref 4.0–10.5)
nRBC: 0.9 % — ABNORMAL HIGH (ref 0.0–0.2)

## 2019-06-10 LAB — HEPATIC FUNCTION PANEL
ALT: 58 U/L — ABNORMAL HIGH (ref 0–44)
AST: 54 U/L — ABNORMAL HIGH (ref 15–41)
Albumin: 1.4 g/dL — ABNORMAL LOW (ref 3.5–5.0)
Alkaline Phosphatase: 214 U/L — ABNORMAL HIGH (ref 38–126)
Bilirubin, Direct: 0.4 mg/dL — ABNORMAL HIGH (ref 0.0–0.2)
Indirect Bilirubin: 0.8 mg/dL (ref 0.3–0.9)
Total Bilirubin: 1.2 mg/dL (ref 0.3–1.2)
Total Protein: 6.1 g/dL — ABNORMAL LOW (ref 6.5–8.1)

## 2019-06-10 LAB — GLUCOSE, CAPILLARY
Glucose-Capillary: 247 mg/dL — ABNORMAL HIGH (ref 70–99)
Glucose-Capillary: 211 mg/dL — ABNORMAL HIGH (ref 70–99)
Glucose-Capillary: 269 mg/dL — ABNORMAL HIGH (ref 70–99)
Glucose-Capillary: 219 mg/dL — ABNORMAL HIGH (ref 70–99)
Glucose-Capillary: 144 mg/dL — ABNORMAL HIGH (ref 70–99)

## 2019-06-10 LAB — BASIC METABOLIC PANEL
Anion gap: 8 (ref 5–15)
BUN: 52 mg/dL — ABNORMAL HIGH (ref 8–23)
CO2: 20 mmol/L — ABNORMAL LOW (ref 22–32)
Calcium: 9.3 mg/dL (ref 8.9–10.3)
Chloride: 125 mmol/L — ABNORMAL HIGH (ref 98–111)
Creatinine, Ser: 0.7 mg/dL (ref 0.61–1.24)
GFR calc Af Amer: >60 mL/min (ref >60)
GFR calc non Af Amer: >60 mL/min (ref >60)
Glucose, Bld: 306 mg/dL — ABNORMAL HIGH (ref 70–99)
Potassium: 3.7 mmol/L (ref 3.5–5.1)
Sodium: 153 mmol/L — ABNORMAL HIGH (ref 135–145)

## 2019-06-10 LAB — URINE CULTURE: Culture: >=100000 — AB

## 2019-06-10 LAB — LACTIC ACID, PLASMA: Lactic Acid, Venous: 2.1 mmol/L (ref 0.5–1.9)

## 2019-06-10 LAB — HEMOGLOBIN AND HEMATOCRIT, BLOOD
HCT: 26.7 % — ABNORMAL LOW (ref 39.0–52.0)
Hemoglobin: 8.1 g/dL — ABNORMAL LOW (ref 13.0–17.0)

## 2019-06-10 LAB — PREPARE RBC (CROSSMATCH)

## 2019-06-10 LAB — ABO/RH: ABO/RH(D): A POS

## 2019-06-10 LAB — MAGNESIUM: Magnesium: 2.5 mg/dL — ABNORMAL HIGH (ref 1.7–2.4)

## 2019-06-10 LAB — PHOSPHORUS: Phosphorus: 2.2 mg/dL — ABNORMAL LOW (ref 2.5–4.6)

## 2019-06-10 MED ORDER — FREE WATER
200.0000 mL | Freq: Four times a day (QID) | Status: DC
  Administered 2019-06-10 – 2019-06-13 (×12): 200 mL

## 2019-06-10 MED ORDER — INSULIN DETEMIR 100 UNIT/ML ~~LOC~~ SOLN
10.0000 [IU] | Freq: Every day | SUBCUTANEOUS | Status: DC
  Filled 2019-06-10 (×2): qty 0.1

## 2019-06-10 MED ORDER — SODIUM CHLORIDE 0.9 % IV SOLN: INTRAVENOUS | Status: DC | PRN

## 2019-06-10 MED ORDER — CHLORHEXIDINE GLUCONATE CLOTH 2 % EX PADS
6.0000 | MEDICATED_PAD | Freq: Every day | CUTANEOUS | Status: DC
  Administered 2019-06-10 – 2019-06-12 (×3): 6 via TOPICAL

## 2019-06-10 MED ORDER — SODIUM CHLORIDE 0.9% IV SOLUTION: Freq: Once | INTRAVENOUS | Status: DC

## 2019-06-10 NOTE — Progress Notes (Signed)
Patient has vomited twice this afternoon despite zofran dose. Will hold TF and call CCM. Will continue to monitor. Bartholomew Crews, RN 06/10/2019 5:23 PM

## 2019-06-10 NOTE — Progress Notes (Signed)
CRITICAL VALUE ALERT  Critical Value:  Hemoglobin 6.4, lactic acid 2.1  Date & Time Notied:  06/10/19 0440  Provider Notified: Oletta Darter, MD  Orders Received/Actions taken: see new orders
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
## 2019-06-10 NOTE — Progress Notes (Signed)
[facility] Progress Note Patient Name: Malyk Girouard DOB: 1956-09-04 MRN: [id number]   Date of Service  06/10/2019  HPI/Events of Note  Anemia - Hgb = 6.4.   eICU Interventions  Will transfuse 1 unit PRBC now.      Intervention Category Major Interventions: Other:  Johnson Arizola Cornelia Copa 06/10/2019, 4:44 AM

## 2019-06-10 NOTE — Progress Notes (Signed)
Patient Brother Radames Mejorado contacted via phone with update.   Tammy Parrett NP-C  [facility]  [phone]  06/10/2019
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
## 2019-06-11 ENCOUNTER — Inpatient Hospital Stay (HOSPITAL_COMMUNITY): Payer: Medicare Other

## 2019-06-11 LAB — TYPE AND SCREEN
ABO/RH(D): A POS
Antibody Screen: NEGATIVE
Unit division: 0

## 2019-06-11 LAB — CULTURE, RESPIRATORY W GRAM STAIN: Gram Stain: NONE SEEN

## 2019-06-11 LAB — CBC
HCT: 27 % — ABNORMAL LOW (ref 39.0–52.0)
Hemoglobin: 8.2 g/dL — ABNORMAL LOW (ref 13.0–17.0)
MCH: 30.5 pg (ref 26.0–34.0)
MCHC: 30.4 g/dL (ref 30.0–36.0)
MCV: 100.4 fL — ABNORMAL HIGH (ref 80.0–100.0)
Platelets: 98 10*3/uL — ABNORMAL LOW (ref 150–400)
RBC: 2.69 MIL/uL — ABNORMAL LOW (ref 4.22–5.81)
RDW: 19.4 % — ABNORMAL HIGH (ref 11.5–15.5)
WBC: 9 10*3/uL (ref 4.0–10.5)
nRBC: 0.6 % — ABNORMAL HIGH (ref 0.0–0.2)

## 2019-06-11 LAB — BASIC METABOLIC PANEL
Anion gap: 7 (ref 5–15)
Anion gap: 8 (ref 5–15)
BUN: 52 mg/dL — ABNORMAL HIGH (ref 8–23)
BUN: 53 mg/dL — ABNORMAL HIGH (ref 8–23)
CO2: 22 mmol/L (ref 22–32)
CO2: 22 mmol/L (ref 22–32)
Calcium: 9.2 mg/dL (ref 8.9–10.3)
Calcium: 9.3 mg/dL (ref 8.9–10.3)
Chloride: 124 mmol/L — ABNORMAL HIGH (ref 98–111)
Chloride: 125 mmol/L — ABNORMAL HIGH (ref 98–111)
Creatinine, Ser: 0.44 mg/dL — ABNORMAL LOW (ref 0.61–1.24)
Creatinine, Ser: 0.57 mg/dL — ABNORMAL LOW (ref 0.61–1.24)
GFR calc Af Amer: >60 mL/min (ref >60)
GFR calc Af Amer: >60 mL/min (ref >60)
GFR calc non Af Amer: >60 mL/min (ref >60)
GFR calc non Af Amer: >60 mL/min (ref >60)
Glucose, Bld: 104 mg/dL — ABNORMAL HIGH (ref 70–99)
Glucose, Bld: 99 mg/dL (ref 70–99)
Potassium: 3.5 mmol/L (ref 3.5–5.1)
Potassium: 3.8 mmol/L (ref 3.5–5.1)
Sodium: 154 mmol/L — ABNORMAL HIGH (ref 135–145)
Sodium: 154 mmol/L — ABNORMAL HIGH (ref 135–145)

## 2019-06-11 LAB — AEROBIC/ANAEROBIC CULTURE W GRAM STAIN (SURGICAL/DEEP WOUND): Special Requests: NORMAL

## 2019-06-11 LAB — GLUCOSE, CAPILLARY
Glucose-Capillary: 121 mg/dL — ABNORMAL HIGH (ref 70–99)
Glucose-Capillary: 130 mg/dL — ABNORMAL HIGH (ref 70–99)
Glucose-Capillary: 80 mg/dL (ref 70–99)
Glucose-Capillary: 82 mg/dL (ref 70–99)
Glucose-Capillary: 82 mg/dL (ref 70–99)
Glucose-Capillary: 90 mg/dL (ref 70–99)

## 2019-06-11 LAB — BPAM RBC
Blood Product Expiration Date: 202007102359
ISSUE DATE / TIME: 202006200734
Unit Type and Rh: 6200

## 2019-06-11 LAB — CULTURE, BLOOD (ROUTINE X 2)

## 2019-06-11 MED ORDER — ALTEPLASE 2 MG IJ SOLR
2.0000 mg | Freq: Once | INTRAMUSCULAR | Status: AC
  Administered 2019-06-11: 2 mg
  Filled 2019-06-11: qty 2

## 2019-06-11 MED ORDER — INSULIN DETEMIR 100 UNIT/ML ~~LOC~~ SOLN
5.0000 [IU] | Freq: Every day | SUBCUTANEOUS | Status: DC
  Administered 2019-06-11 – 2019-06-12 (×2): 5 [IU] via SUBCUTANEOUS
  Filled 2019-06-11 (×3): qty 0.05

## 2019-06-11 MED ORDER — SCOPOLAMINE 1 MG/3DAYS TD PT72
1.0000 | MEDICATED_PATCH | TRANSDERMAL | Status: DC
  Administered 2019-06-11: 1.5 mg via TRANSDERMAL
  Filled 2019-06-11: qty 1
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
## 2019-06-11 NOTE — Progress Notes (Signed)
PCCM is primary attending and TRH will sign off. Please call us if needed.   Hosie Poisson, MD [phone]

## 2019-06-12 ENCOUNTER — Inpatient Hospital Stay (HOSPITAL_COMMUNITY): Payer: Medicare Other

## 2019-06-12 LAB — CBC
HCT: 28.2 % — ABNORMAL LOW (ref 39.0–52.0)
Hemoglobin: 8.3 g/dL — ABNORMAL LOW (ref 13.0–17.0)
MCH: 30 pg (ref 26.0–34.0)
MCHC: 29.4 g/dL — ABNORMAL LOW (ref 30.0–36.0)
MCV: 101.8 fL — ABNORMAL HIGH (ref 80.0–100.0)
Platelets: 112 10*3/uL — ABNORMAL LOW (ref 150–400)
RBC: 2.77 MIL/uL — ABNORMAL LOW (ref 4.22–5.81)
RDW: 18.8 % — ABNORMAL HIGH (ref 11.5–15.5)
WBC: 6.8 10*3/uL (ref 4.0–10.5)
nRBC: 0.9 % — ABNORMAL HIGH (ref 0.0–0.2)

## 2019-06-12 LAB — BASIC METABOLIC PANEL
Anion gap: 7 (ref 5–15)
BUN: 50 mg/dL — ABNORMAL HIGH (ref 8–23)
CO2: 22 mmol/L (ref 22–32)
Calcium: 9.1 mg/dL (ref 8.9–10.3)
Chloride: 122 mmol/L — ABNORMAL HIGH (ref 98–111)
Creatinine, Ser: 0.61 mg/dL (ref 0.61–1.24)
GFR calc Af Amer: >60 mL/min (ref >60)
GFR calc non Af Amer: >60 mL/min (ref >60)
Glucose, Bld: 156 mg/dL — ABNORMAL HIGH (ref 70–99)
Potassium: 3.8 mmol/L (ref 3.5–5.1)
Sodium: 151 mmol/L — ABNORMAL HIGH (ref 135–145)

## 2019-06-12 LAB — GLUCOSE, CAPILLARY
Glucose-Capillary: 117 mg/dL — ABNORMAL HIGH (ref 70–99)
Glucose-Capillary: 129 mg/dL — ABNORMAL HIGH (ref 70–99)
Glucose-Capillary: 141 mg/dL — ABNORMAL HIGH (ref 70–99)
Glucose-Capillary: 144 mg/dL — ABNORMAL HIGH (ref 70–99)
Glucose-Capillary: 144 mg/dL — ABNORMAL HIGH (ref 70–99)
Glucose-Capillary: 148 mg/dL — ABNORMAL HIGH (ref 70–99)
Glucose-Capillary: 149 mg/dL — ABNORMAL HIGH (ref 70–99)

## 2019-06-12 LAB — LACTIC ACID, PLASMA: Lactic Acid, Venous: 1.5 mmol/L (ref 0.5–1.9)
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
## 2019-06-12 NOTE — TOC Progression Note (Addendum)
Transition of Care [facility]) - Progression Note    Patient Details  Name: Francis Osborne MRN: [id number] Date of Birth: 1956/12/01  [facility] CM/SW Contact  Maryclare Labrador, RN Phone Number: 06/12/2019, 9:06 AM  Clinical Narrative:   Pt continues to require vent both day and night ( baseline is TC during day and vent during night).  Per CSW; pts brother voiced opposition for pt to return to [facility] during last admit however pt was in agreement.  CM received consult for LTACH.  CM requested [facility] and [facility] to review chart for possible bed offer based on pt being unable to wean from vent during the day.   09:06  [facility] declined pt..  [facility] can offer bed today.  Attending group made aware and will follow up with CM regarding if pt can discharge today - group also to reach out to family today to discuss LTACH or SNF at [facility] or probably out of state placement.   15:00  CM spoke with both pts brother and sister as Per attending pts orientation continues to be in question as he recently had hallucinations - CM explained LTACH and SNFs - gave family the  one option for LTACH of [facility].  1630:  Sister followed up with CM regarding LTACH - both brother and sister are in agreement with pt to discharge to [facility] - both siblings spoke with [place] liaison.  .  Plan is for pt to discharge to [facility] tomorrow 6/23  Expected Discharge Plan: [place]    Expected Discharge Plan and Services Expected Discharge Plan: [place] In-house Referral: Clinical Social Work Discharge Planning Services: CM Consult   Living arrangements for the past 2 months: [place]                                       Social Determinants of Health (SDOH) Interventions    Readmission Risk Interventions No flowsheet data found.

## 2019-06-12 NOTE — Progress Notes (Signed)
Brother updated on patient condition and treatment plan  Discussed LTAC options,  Prefers not to go to [facility] , will discuss with CM .   Roshelle Traub NP-C  [facility]  [phone]  06/12/2019

## 2019-06-13 LAB — BASIC METABOLIC PANEL
Anion gap: 8 (ref 5–15)
BUN: 44 mg/dL — ABNORMAL HIGH (ref 8–23)
CO2: 24 mmol/L (ref 22–32)
Calcium: 9.1 mg/dL (ref 8.9–10.3)
Chloride: 118 mmol/L — ABNORMAL HIGH (ref 98–111)
Creatinine, Ser: 0.46 mg/dL — ABNORMAL LOW (ref 0.61–1.24)
GFR calc Af Amer: >60 mL/min (ref >60)
GFR calc non Af Amer: >60 mL/min (ref >60)
Glucose, Bld: 164 mg/dL — ABNORMAL HIGH (ref 70–99)
Potassium: 3.7 mmol/L (ref 3.5–5.1)
Sodium: 150 mmol/L — ABNORMAL HIGH (ref 135–145)

## 2019-06-13 LAB — CBC
HCT: 28.1 % — ABNORMAL LOW (ref 39.0–52.0)
Hemoglobin: 8.5 g/dL — ABNORMAL LOW (ref 13.0–17.0)
MCH: 30.2 pg (ref 26.0–34.0)
MCHC: 30.2 g/dL (ref 30.0–36.0)
MCV: 100 fL (ref 80.0–100.0)
Platelets: 120 10*3/uL — ABNORMAL LOW (ref 150–400)
RBC: 2.81 MIL/uL — ABNORMAL LOW (ref 4.22–5.81)
RDW: 18.2 % — ABNORMAL HIGH (ref 11.5–15.5)
WBC: 9.4 10*3/uL (ref 4.0–10.5)
nRBC: 0.5 % — ABNORMAL HIGH (ref 0.0–0.2)

## 2019-06-13 LAB — GLUCOSE, CAPILLARY
Glucose-Capillary: 134 mg/dL — ABNORMAL HIGH (ref 70–99)
Glucose-Capillary: 120 mg/dL — ABNORMAL HIGH (ref 70–99)
Glucose-Capillary: 126 mg/dL — ABNORMAL HIGH (ref 70–99)

## 2019-06-13 MED ORDER — PRO-STAT SUGAR FREE PO LIQD: 30.0000 mL | Freq: Three times a day (TID) | ORAL | 0 refills | Status: AC

## 2019-06-13 MED ORDER — SODIUM CHLORIDE 0.9 % IV SOLN: 2.0000 g | Freq: Three times a day (TID) | INTRAVENOUS | Status: AC

## 2019-06-13 MED ORDER — FLUDROCORTISONE ACETATE 0.1 MG PO TABS: 0.1000 mg | ORAL_TABLET | ORAL | Status: AC

## 2019-06-13 MED ORDER — MUPIROCIN 2 % EX OINT: 1.0000 "application " | TOPICAL_OINTMENT | Freq: Two times a day (BID) | CUTANEOUS | 0 refills | Status: AC

## 2019-06-13 MED ORDER — ADULT MULTIVITAMIN W/MINERALS CH: 1.0000 | ORAL_TABLET | Freq: Every day | ORAL | Status: AC

## 2019-06-13 MED ORDER — COLLAGENASE 250 UNIT/GM EX OINT: TOPICAL_OINTMENT | Freq: Every day | CUTANEOUS | 0 refills | Status: AC

## 2019-06-13 MED ORDER — OSMOLITE 1.2 CAL PO LIQD: 1000.0000 mL | ORAL | 0 refills | Status: AC

## 2019-06-13 MED ORDER — MIDAZOLAM HCL 2 MG/2ML IJ SOLN: 1.0000 mg | INTRAMUSCULAR | 0 refills | Status: AC | PRN

## 2019-06-13 MED ORDER — CHLORHEXIDINE GLUCONATE CLOTH 2 % EX PADS: 6.0000 | MEDICATED_PAD | Freq: Every day | CUTANEOUS | Status: AC

## 2019-06-13 MED ORDER — PANTOPRAZOLE SODIUM 40 MG PO PACK: 40.0000 mg | PACK | Freq: Every day | ORAL | Status: AC

## 2019-06-13 MED ORDER — FREE WATER: 200.0000 mL | Freq: Four times a day (QID) | Status: AC

## 2019-06-13 MED ORDER — FENTANYL CITRATE (PF) 100 MCG/2ML IJ SOLN: 25.0000 ug | INTRAMUSCULAR | 0 refills | Status: AC | PRN

## 2019-06-13 MED ORDER — ONDANSETRON HCL 4 MG PO TABS: 4.0000 mg | ORAL_TABLET | Freq: Four times a day (QID) | ORAL | 0 refills | Status: AC | PRN

## 2019-06-13 MED ORDER — ENOXAPARIN SODIUM 40 MG/0.4ML ~~LOC~~ SOLN: 40.0000 mg | SUBCUTANEOUS | Status: AC

## 2019-06-13 MED ORDER — SCOPOLAMINE 1 MG/3DAYS TD PT72: 1.0000 | MEDICATED_PATCH | TRANSDERMAL | 12 refills | Status: AC

## 2019-06-13 MED ORDER — MIDODRINE HCL 5 MG PO TABS: 5.0000 mg | ORAL_TABLET | Freq: Three times a day (TID) | ORAL | Status: AC

## 2019-06-13 MED ORDER — INSULIN ASPART 100 UNIT/ML ~~LOC~~ SOLN: 0.0000 [IU] | SUBCUTANEOUS | 11 refills | Status: AC

## 2019-06-13 MED ORDER — CHLORHEXIDINE GLUCONATE 0.12% ORAL RINSE (MEDLINE KIT): 15.0000 mL | Freq: Two times a day (BID) | OROMUCOSAL | 0 refills | Status: AC

## 2019-06-13 MED ORDER — INSULIN DETEMIR 100 UNIT/ML ~~LOC~~ SOLN: 5.0000 [IU] | Freq: Every day | SUBCUTANEOUS | 11 refills | Status: AC

## 2019-06-13 MED ORDER — ORAL CARE MOUTH RINSE: 15.0000 mL | Freq: Every morning | OROMUCOSAL | 0 refills | Status: AC

## 2019-06-13 MED ORDER — ALBUTEROL SULFATE (2.5 MG/3ML) 0.083% IN NEBU: 2.5000 mg | INHALATION_SOLUTION | Freq: Four times a day (QID) | RESPIRATORY_TRACT | 12 refills | Status: AC | PRN
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
## 2019-06-13 NOTE — Progress Notes (Signed)
NCM received call from [facility] @ [phone] . [facility] informed NCM pt's room @ [facility] is 317, accepting MD (Dr. Garwin Brothers), and # to call to give report [phone]. NCM shared information with bedside nurse. Whitman Hero RN,BSN,CM

## 2019-06-13 NOTE — TOC Transition Note (Signed)
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 Pt will transition to [facility] today. [facility] will be called and transportation arranged for pt pick up per bedside nurse. Transition packet left @ bedside with nurse.  Final next level of care: Long Term Acute Care (LTAC)([facility]) Barriers to Discharge: No Barriers Identified   Patient Goals and CMS Choice   CMS Medicare.gov Compare Post Acute Care list provided to:: (sister and brother)    Discharge Placement                       Discharge Plan and Services In-house Referral: Clinical Social Work Discharge Planning Services: CM Consult Post Acute Care Choice: Long Term Acute Care (LTAC)                               Social Determinants of Health (SDOH) Interventions     Readmission Risk Interventions Readmission Risk Prevention Plan 06/12/2019  Transportation Screening Complete  PCP or Specialist Appt within 3-5 Days Not Complete

## 2019-06-13 NOTE — Progress Notes (Signed)
Pt. discharged to [facility] via [facility]. Situation explained to patient in detail. Report called to Eual Fines, RN at [phone]. Loren, Liaison for [facility], notified as well as pt.'s sister Ishmael Holter (who expressed that she will notify brother, Art). VSS. All hospital acquired belongings sent with patient.

## 2019-06-19 DIAGNOSIS — J151 Pneumonia due to Pseudomonas: Secondary | ICD-10-CM

## 2019-06-19 DIAGNOSIS — R6521 Severe sepsis with septic shock: Secondary | ICD-10-CM

## 2019-06-19 DIAGNOSIS — J9621 Acute and chronic respiratory failure with hypoxia: Secondary | ICD-10-CM

## 2019-06-19 DIAGNOSIS — G40509 Epileptic seizures related to external causes, not intractable, without status epilepticus: Secondary | ICD-10-CM

## 2019-06-20 DIAGNOSIS — J151 Pneumonia due to Pseudomonas: Secondary | ICD-10-CM

## 2019-06-20 DIAGNOSIS — J9621 Acute and chronic respiratory failure with hypoxia: Secondary | ICD-10-CM

## 2019-06-20 DIAGNOSIS — G40509 Epileptic seizures related to external causes, not intractable, without status epilepticus: Secondary | ICD-10-CM

## 2019-06-20 DIAGNOSIS — R6521 Severe sepsis with septic shock: Secondary | ICD-10-CM

## 2019-06-21 DIAGNOSIS — G40509 Epileptic seizures related to external causes, not intractable, without status epilepticus: Secondary | ICD-10-CM

## 2019-06-21 DIAGNOSIS — R6521 Severe sepsis with septic shock: Secondary | ICD-10-CM

## 2019-06-21 DIAGNOSIS — J9621 Acute and chronic respiratory failure with hypoxia: Secondary | ICD-10-CM

## 2019-06-21 DIAGNOSIS — J151 Pneumonia due to Pseudomonas: Secondary | ICD-10-CM

## 2019-06-22 DIAGNOSIS — J9621 Acute and chronic respiratory failure with hypoxia: Secondary | ICD-10-CM

## 2019-06-22 DIAGNOSIS — G40509 Epileptic seizures related to external causes, not intractable, without status epilepticus: Secondary | ICD-10-CM

## 2019-06-22 DIAGNOSIS — R6521 Severe sepsis with septic shock: Secondary | ICD-10-CM

## 2019-06-22 DIAGNOSIS — J151 Pneumonia due to Pseudomonas: Secondary | ICD-10-CM

## 2019-06-23 DIAGNOSIS — R6521 Severe sepsis with septic shock: Secondary | ICD-10-CM

## 2019-06-23 DIAGNOSIS — G40509 Epileptic seizures related to external causes, not intractable, without status epilepticus: Secondary | ICD-10-CM

## 2019-06-23 DIAGNOSIS — J151 Pneumonia due to Pseudomonas: Secondary | ICD-10-CM

## 2019-06-23 DIAGNOSIS — J9621 Acute and chronic respiratory failure with hypoxia: Secondary | ICD-10-CM

## 2019-06-24 DIAGNOSIS — J9621 Acute and chronic respiratory failure with hypoxia: Secondary | ICD-10-CM

## 2019-06-24 DIAGNOSIS — J151 Pneumonia due to Pseudomonas: Secondary | ICD-10-CM

## 2019-06-24 DIAGNOSIS — G40509 Epileptic seizures related to external causes, not intractable, without status epilepticus: Secondary | ICD-10-CM

## 2019-06-24 DIAGNOSIS — R6521 Severe sepsis with septic shock: Secondary | ICD-10-CM

## 2019-06-25 DIAGNOSIS — G40509 Epileptic seizures related to external causes, not intractable, without status epilepticus: Secondary | ICD-10-CM

## 2019-06-25 DIAGNOSIS — J151 Pneumonia due to Pseudomonas: Secondary | ICD-10-CM

## 2019-06-25 DIAGNOSIS — J9621 Acute and chronic respiratory failure with hypoxia: Secondary | ICD-10-CM

## 2019-06-25 DIAGNOSIS — R6521 Severe sepsis with septic shock: Secondary | ICD-10-CM

## 2019-06-26 DIAGNOSIS — J9621 Acute and chronic respiratory failure with hypoxia: Secondary | ICD-10-CM

## 2019-06-26 DIAGNOSIS — R6521 Severe sepsis with septic shock: Secondary | ICD-10-CM

## 2019-06-26 DIAGNOSIS — G40509 Epileptic seizures related to external causes, not intractable, without status epilepticus: Secondary | ICD-10-CM

## 2019-06-26 DIAGNOSIS — J151 Pneumonia due to Pseudomonas: Secondary | ICD-10-CM

## 2019-06-27 DIAGNOSIS — J151 Pneumonia due to Pseudomonas: Secondary | ICD-10-CM

## 2019-06-27 DIAGNOSIS — J9621 Acute and chronic respiratory failure with hypoxia: Secondary | ICD-10-CM

## 2019-06-27 DIAGNOSIS — G40509 Epileptic seizures related to external causes, not intractable, without status epilepticus: Secondary | ICD-10-CM

## 2019-06-27 DIAGNOSIS — R6521 Severe sepsis with septic shock: Secondary | ICD-10-CM

## 2019-06-28 DIAGNOSIS — J9621 Acute and chronic respiratory failure with hypoxia: Secondary | ICD-10-CM

## 2019-06-28 DIAGNOSIS — G40509 Epileptic seizures related to external causes, not intractable, without status epilepticus: Secondary | ICD-10-CM

## 2019-06-28 DIAGNOSIS — J151 Pneumonia due to Pseudomonas: Secondary | ICD-10-CM

## 2019-06-28 DIAGNOSIS — R6521 Severe sepsis with septic shock: Secondary | ICD-10-CM

## 2019-06-29 DIAGNOSIS — R6521 Severe sepsis with septic shock: Secondary | ICD-10-CM

## 2019-06-29 DIAGNOSIS — G40509 Epileptic seizures related to external causes, not intractable, without status epilepticus: Secondary | ICD-10-CM

## 2019-06-29 DIAGNOSIS — J9621 Acute and chronic respiratory failure with hypoxia: Secondary | ICD-10-CM

## 2019-06-29 DIAGNOSIS — J151 Pneumonia due to Pseudomonas: Secondary | ICD-10-CM

## 2019-06-30 DIAGNOSIS — J9621 Acute and chronic respiratory failure with hypoxia: Secondary | ICD-10-CM

## 2019-06-30 DIAGNOSIS — J151 Pneumonia due to Pseudomonas: Secondary | ICD-10-CM

## 2019-06-30 DIAGNOSIS — G40509 Epileptic seizures related to external causes, not intractable, without status epilepticus: Secondary | ICD-10-CM

## 2019-06-30 DIAGNOSIS — R6521 Severe sepsis with septic shock: Secondary | ICD-10-CM

## 2019-07-01 DIAGNOSIS — J9621 Acute and chronic respiratory failure with hypoxia: Secondary | ICD-10-CM

## 2019-07-01 DIAGNOSIS — G40509 Epileptic seizures related to external causes, not intractable, without status epilepticus: Secondary | ICD-10-CM

## 2019-07-01 DIAGNOSIS — R6521 Severe sepsis with septic shock: Secondary | ICD-10-CM

## 2019-07-01 DIAGNOSIS — J151 Pneumonia due to Pseudomonas: Secondary | ICD-10-CM

## 2019-07-02 DIAGNOSIS — G40509 Epileptic seizures related to external causes, not intractable, without status epilepticus: Secondary | ICD-10-CM

## 2019-07-02 DIAGNOSIS — J151 Pneumonia due to Pseudomonas: Secondary | ICD-10-CM

## 2019-07-02 DIAGNOSIS — R6521 Severe sepsis with septic shock: Secondary | ICD-10-CM

## 2019-07-02 DIAGNOSIS — J9621 Acute and chronic respiratory failure with hypoxia: Secondary | ICD-10-CM

## 2019-07-03 DIAGNOSIS — R6521 Severe sepsis with septic shock: Secondary | ICD-10-CM

## 2019-07-03 DIAGNOSIS — J9621 Acute and chronic respiratory failure with hypoxia: Secondary | ICD-10-CM

## 2019-07-03 DIAGNOSIS — J151 Pneumonia due to Pseudomonas: Secondary | ICD-10-CM

## 2019-07-03 DIAGNOSIS — G40509 Epileptic seizures related to external causes, not intractable, without status epilepticus: Secondary | ICD-10-CM

## 2019-07-10 DIAGNOSIS — J151 Pneumonia due to Pseudomonas: Secondary | ICD-10-CM

## 2019-07-10 DIAGNOSIS — R6521 Severe sepsis with septic shock: Secondary | ICD-10-CM

## 2019-07-10 DIAGNOSIS — G40509 Epileptic seizures related to external causes, not intractable, without status epilepticus: Secondary | ICD-10-CM

## 2019-07-10 DIAGNOSIS — J9621 Acute and chronic respiratory failure with hypoxia: Secondary | ICD-10-CM

## 2019-07-11 DIAGNOSIS — J151 Pneumonia due to Pseudomonas: Secondary | ICD-10-CM

## 2019-07-11 DIAGNOSIS — G40509 Epileptic seizures related to external causes, not intractable, without status epilepticus: Secondary | ICD-10-CM

## 2019-07-11 DIAGNOSIS — J9621 Acute and chronic respiratory failure with hypoxia: Secondary | ICD-10-CM

## 2019-07-11 DIAGNOSIS — R6521 Severe sepsis with septic shock: Secondary | ICD-10-CM

## 2019-07-12 DIAGNOSIS — G40509 Epileptic seizures related to external causes, not intractable, without status epilepticus: Secondary | ICD-10-CM

## 2019-07-12 DIAGNOSIS — J9621 Acute and chronic respiratory failure with hypoxia: Secondary | ICD-10-CM

## 2019-07-12 DIAGNOSIS — J151 Pneumonia due to Pseudomonas: Secondary | ICD-10-CM

## 2019-07-12 DIAGNOSIS — R6521 Severe sepsis with septic shock: Secondary | ICD-10-CM

## 2019-07-13 DIAGNOSIS — J151 Pneumonia due to Pseudomonas: Secondary | ICD-10-CM

## 2019-07-13 DIAGNOSIS — R6521 Severe sepsis with septic shock: Secondary | ICD-10-CM

## 2019-07-13 DIAGNOSIS — G40509 Epileptic seizures related to external causes, not intractable, without status epilepticus: Secondary | ICD-10-CM

## 2019-07-13 DIAGNOSIS — J9621 Acute and chronic respiratory failure with hypoxia: Secondary | ICD-10-CM

## 2019-07-14 DIAGNOSIS — G40509 Epileptic seizures related to external causes, not intractable, without status epilepticus: Secondary | ICD-10-CM

## 2019-07-14 DIAGNOSIS — J151 Pneumonia due to Pseudomonas: Secondary | ICD-10-CM

## 2019-07-14 DIAGNOSIS — J9621 Acute and chronic respiratory failure with hypoxia: Secondary | ICD-10-CM

## 2019-07-14 DIAGNOSIS — R6521 Severe sepsis with septic shock: Secondary | ICD-10-CM

## 2019-07-15 DIAGNOSIS — G40509 Epileptic seizures related to external causes, not intractable, without status epilepticus: Secondary | ICD-10-CM

## 2019-07-15 DIAGNOSIS — J151 Pneumonia due to Pseudomonas: Secondary | ICD-10-CM

## 2019-07-15 DIAGNOSIS — R6521 Severe sepsis with septic shock: Secondary | ICD-10-CM

## 2019-07-15 DIAGNOSIS — J9621 Acute and chronic respiratory failure with hypoxia: Secondary | ICD-10-CM

## 2019-07-16 DIAGNOSIS — J151 Pneumonia due to Pseudomonas: Secondary | ICD-10-CM

## 2019-07-16 DIAGNOSIS — G40509 Epileptic seizures related to external causes, not intractable, without status epilepticus: Secondary | ICD-10-CM

## 2019-07-16 DIAGNOSIS — J9621 Acute and chronic respiratory failure with hypoxia: Secondary | ICD-10-CM

## 2019-07-16 DIAGNOSIS — R6521 Severe sepsis with septic shock: Secondary | ICD-10-CM

## 2019-07-24 DIAGNOSIS — J151 Pneumonia due to Pseudomonas: Secondary | ICD-10-CM

## 2019-07-24 DIAGNOSIS — J9621 Acute and chronic respiratory failure with hypoxia: Secondary | ICD-10-CM

## 2019-07-24 DIAGNOSIS — R6521 Severe sepsis with septic shock: Secondary | ICD-10-CM

## 2019-07-24 DIAGNOSIS — G40509 Epileptic seizures related to external causes, not intractable, without status epilepticus: Secondary | ICD-10-CM

## 2019-07-25 DIAGNOSIS — J151 Pneumonia due to Pseudomonas: Secondary | ICD-10-CM

## 2019-07-25 DIAGNOSIS — J9621 Acute and chronic respiratory failure with hypoxia: Secondary | ICD-10-CM

## 2019-07-25 DIAGNOSIS — R6521 Severe sepsis with septic shock: Secondary | ICD-10-CM

## 2019-07-25 DIAGNOSIS — G40509 Epileptic seizures related to external causes, not intractable, without status epilepticus: Secondary | ICD-10-CM

## 2019-07-26 DIAGNOSIS — R6521 Severe sepsis with septic shock: Secondary | ICD-10-CM

## 2019-07-26 DIAGNOSIS — G40509 Epileptic seizures related to external causes, not intractable, without status epilepticus: Secondary | ICD-10-CM

## 2019-07-26 DIAGNOSIS — J9621 Acute and chronic respiratory failure with hypoxia: Secondary | ICD-10-CM

## 2019-07-26 DIAGNOSIS — J151 Pneumonia due to Pseudomonas: Secondary | ICD-10-CM

## 2019-07-27 DIAGNOSIS — J9621 Acute and chronic respiratory failure with hypoxia: Secondary | ICD-10-CM

## 2019-07-27 DIAGNOSIS — G40509 Epileptic seizures related to external causes, not intractable, without status epilepticus: Secondary | ICD-10-CM

## 2019-07-27 DIAGNOSIS — J151 Pneumonia due to Pseudomonas: Secondary | ICD-10-CM

## 2019-07-27 DIAGNOSIS — R6521 Severe sepsis with septic shock: Secondary | ICD-10-CM

## 2019-07-28 DIAGNOSIS — J151 Pneumonia due to Pseudomonas: Secondary | ICD-10-CM

## 2019-07-28 DIAGNOSIS — G40509 Epileptic seizures related to external causes, not intractable, without status epilepticus: Secondary | ICD-10-CM

## 2019-07-28 DIAGNOSIS — R6521 Severe sepsis with septic shock: Secondary | ICD-10-CM

## 2019-07-28 DIAGNOSIS — J9621 Acute and chronic respiratory failure with hypoxia: Secondary | ICD-10-CM

## 2019-07-29 DIAGNOSIS — J9621 Acute and chronic respiratory failure with hypoxia: Secondary | ICD-10-CM

## 2019-07-29 DIAGNOSIS — J151 Pneumonia due to Pseudomonas: Secondary | ICD-10-CM

## 2019-07-29 DIAGNOSIS — G40509 Epileptic seizures related to external causes, not intractable, without status epilepticus: Secondary | ICD-10-CM

## 2019-07-29 DIAGNOSIS — R6521 Severe sepsis with septic shock: Secondary | ICD-10-CM

## 2019-07-30 DIAGNOSIS — J151 Pneumonia due to Pseudomonas: Secondary | ICD-10-CM

## 2019-07-30 DIAGNOSIS — G40509 Epileptic seizures related to external causes, not intractable, without status epilepticus: Secondary | ICD-10-CM

## 2019-07-30 DIAGNOSIS — R6521 Severe sepsis with septic shock: Secondary | ICD-10-CM

## 2019-07-30 DIAGNOSIS — J9621 Acute and chronic respiratory failure with hypoxia: Secondary | ICD-10-CM

## 2019-08-07 DIAGNOSIS — J9621 Acute and chronic respiratory failure with hypoxia: Secondary | ICD-10-CM

## 2019-08-07 DIAGNOSIS — R6521 Severe sepsis with septic shock: Secondary | ICD-10-CM

## 2019-08-07 DIAGNOSIS — G40509 Epileptic seizures related to external causes, not intractable, without status epilepticus: Secondary | ICD-10-CM

## 2019-08-07 DIAGNOSIS — J151 Pneumonia due to Pseudomonas: Secondary | ICD-10-CM

## 2019-08-08 DIAGNOSIS — G40509 Epileptic seizures related to external causes, not intractable, without status epilepticus: Secondary | ICD-10-CM

## 2019-08-08 DIAGNOSIS — J9621 Acute and chronic respiratory failure with hypoxia: Secondary | ICD-10-CM

## 2019-08-08 DIAGNOSIS — J151 Pneumonia due to Pseudomonas: Secondary | ICD-10-CM

## 2019-08-08 DIAGNOSIS — R6521 Severe sepsis with septic shock: Secondary | ICD-10-CM

## 2019-08-09 DIAGNOSIS — R6521 Severe sepsis with septic shock: Secondary | ICD-10-CM

## 2019-08-09 DIAGNOSIS — J151 Pneumonia due to Pseudomonas: Secondary | ICD-10-CM

## 2019-08-09 DIAGNOSIS — J9621 Acute and chronic respiratory failure with hypoxia: Secondary | ICD-10-CM

## 2019-08-09 DIAGNOSIS — G40509 Epileptic seizures related to external causes, not intractable, without status epilepticus: Secondary | ICD-10-CM

## 2019-08-10 DIAGNOSIS — J9621 Acute and chronic respiratory failure with hypoxia: Secondary | ICD-10-CM

## 2019-08-10 DIAGNOSIS — G40509 Epileptic seizures related to external causes, not intractable, without status epilepticus: Secondary | ICD-10-CM

## 2019-08-10 DIAGNOSIS — R6521 Severe sepsis with septic shock: Secondary | ICD-10-CM

## 2019-08-10 DIAGNOSIS — J151 Pneumonia due to Pseudomonas: Secondary | ICD-10-CM

## 2020-02-19 DEATH — deceased

## 2020-12-12 IMAGING — DX PORTABLE CHEST - 1 VIEW
2 series · 2 of 2 positions shown · non-contrast
Comparison: 05/08/2019

CLINICAL DATA: Respiratory failure

EXAM:
PORTABLE CHEST 1 VIEW

[chest ap (1 of 2)]
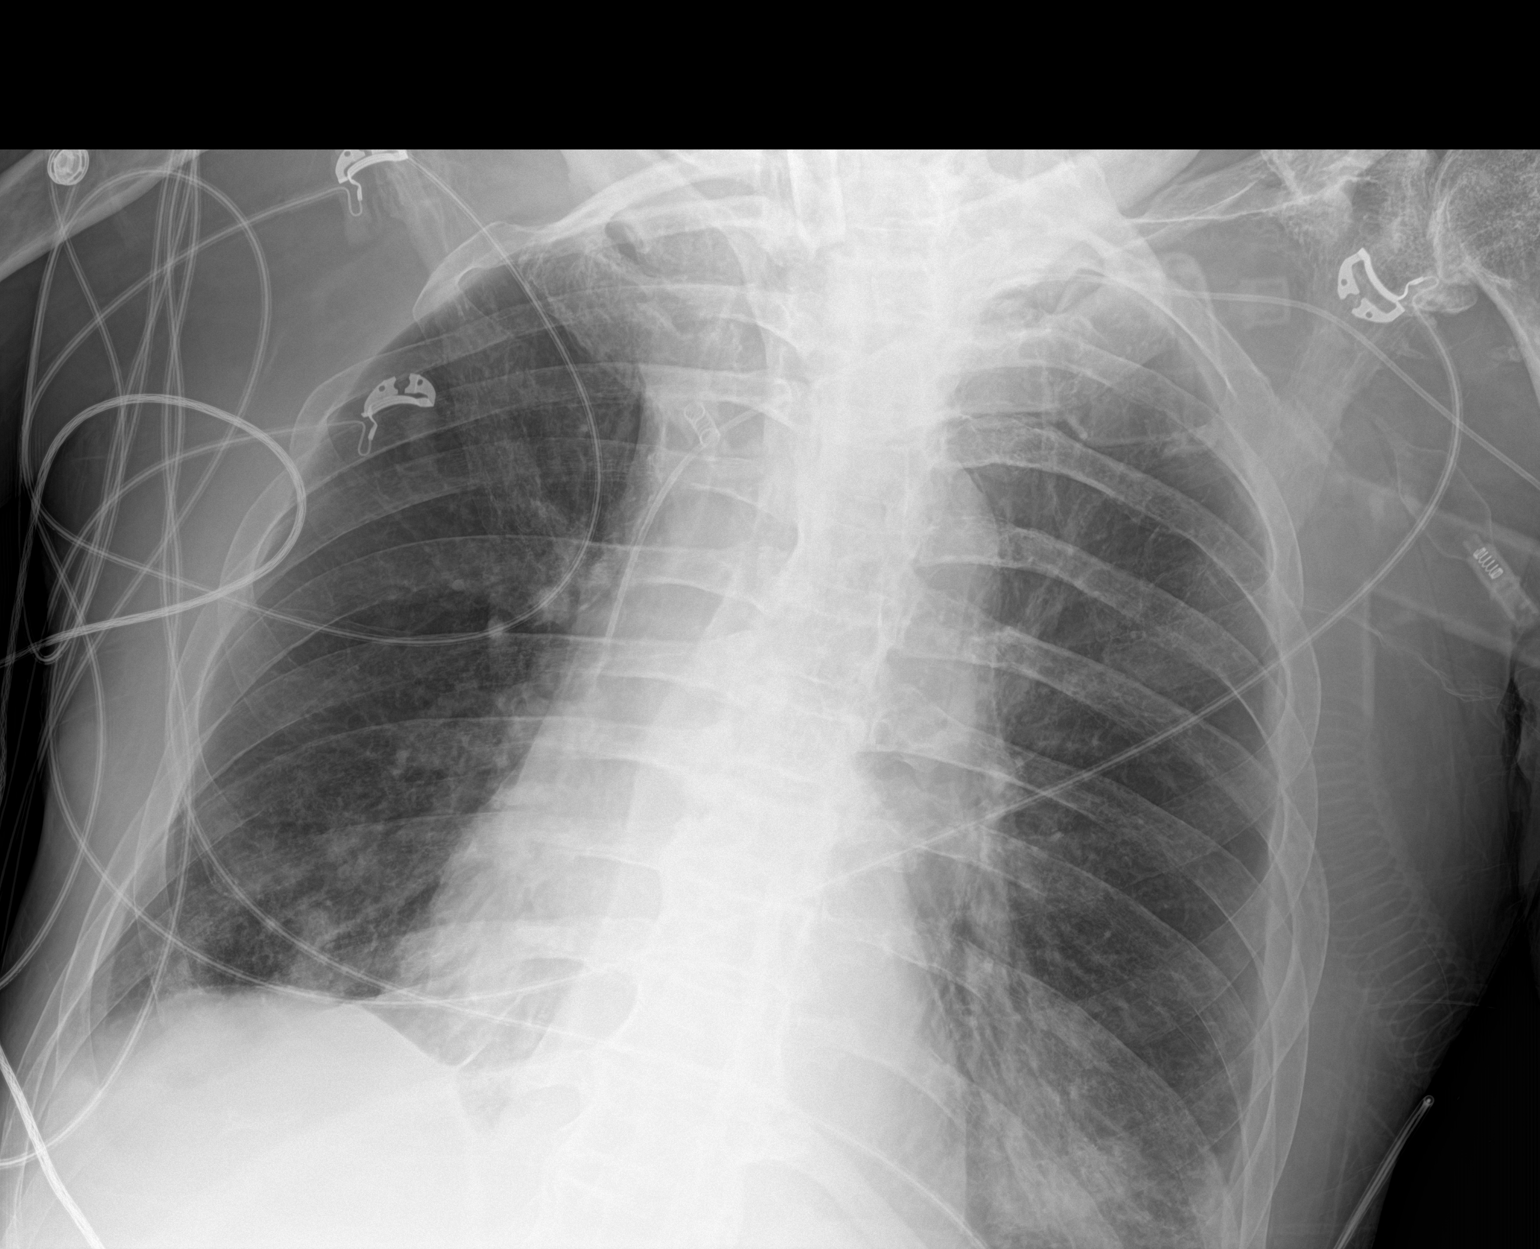

[chest ap (2 of 2)]
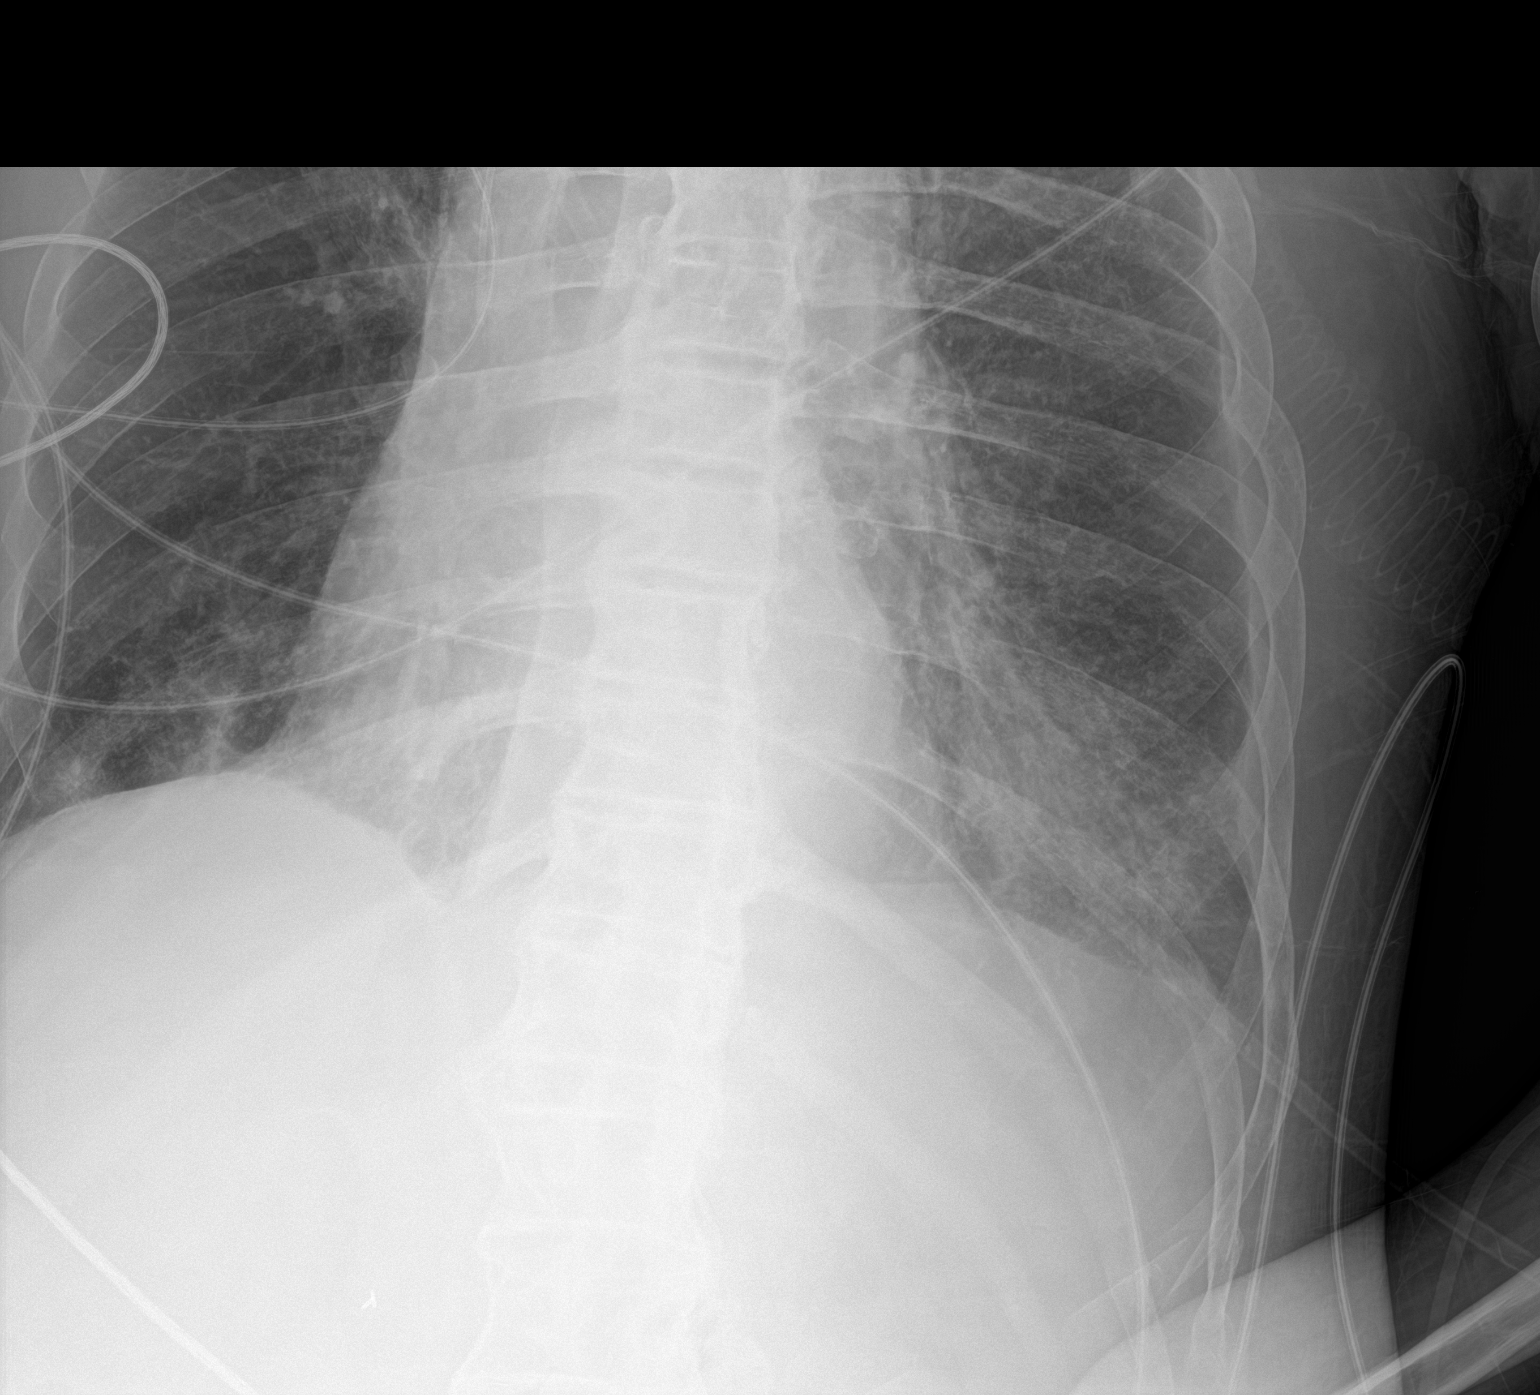

[2 of 2 positions shown; findings below may reference images not displayed]

FINDINGS: Cardiac shadow is stable. Left-sided PICC line and tracheostomy tube
are noted in satisfactory position. Left basilar infiltrate is noted
slightly progressed in the interval from the prior exam. No new
focal infiltrate is seen. No bony abnormality is noted.
IMPRESSION: Slight increase in left basilar infiltrate.

## 2021-01-10 IMAGING — DX PORTABLE CHEST - 1 VIEW
1 series · 1 of 1 positions shown · non-contrast
Comparison: 06/08/2019 05/11/2019.

CLINICAL DATA: Hypoxia.

EXAM:
PORTABLE CHEST 1 VIEW

[chest]
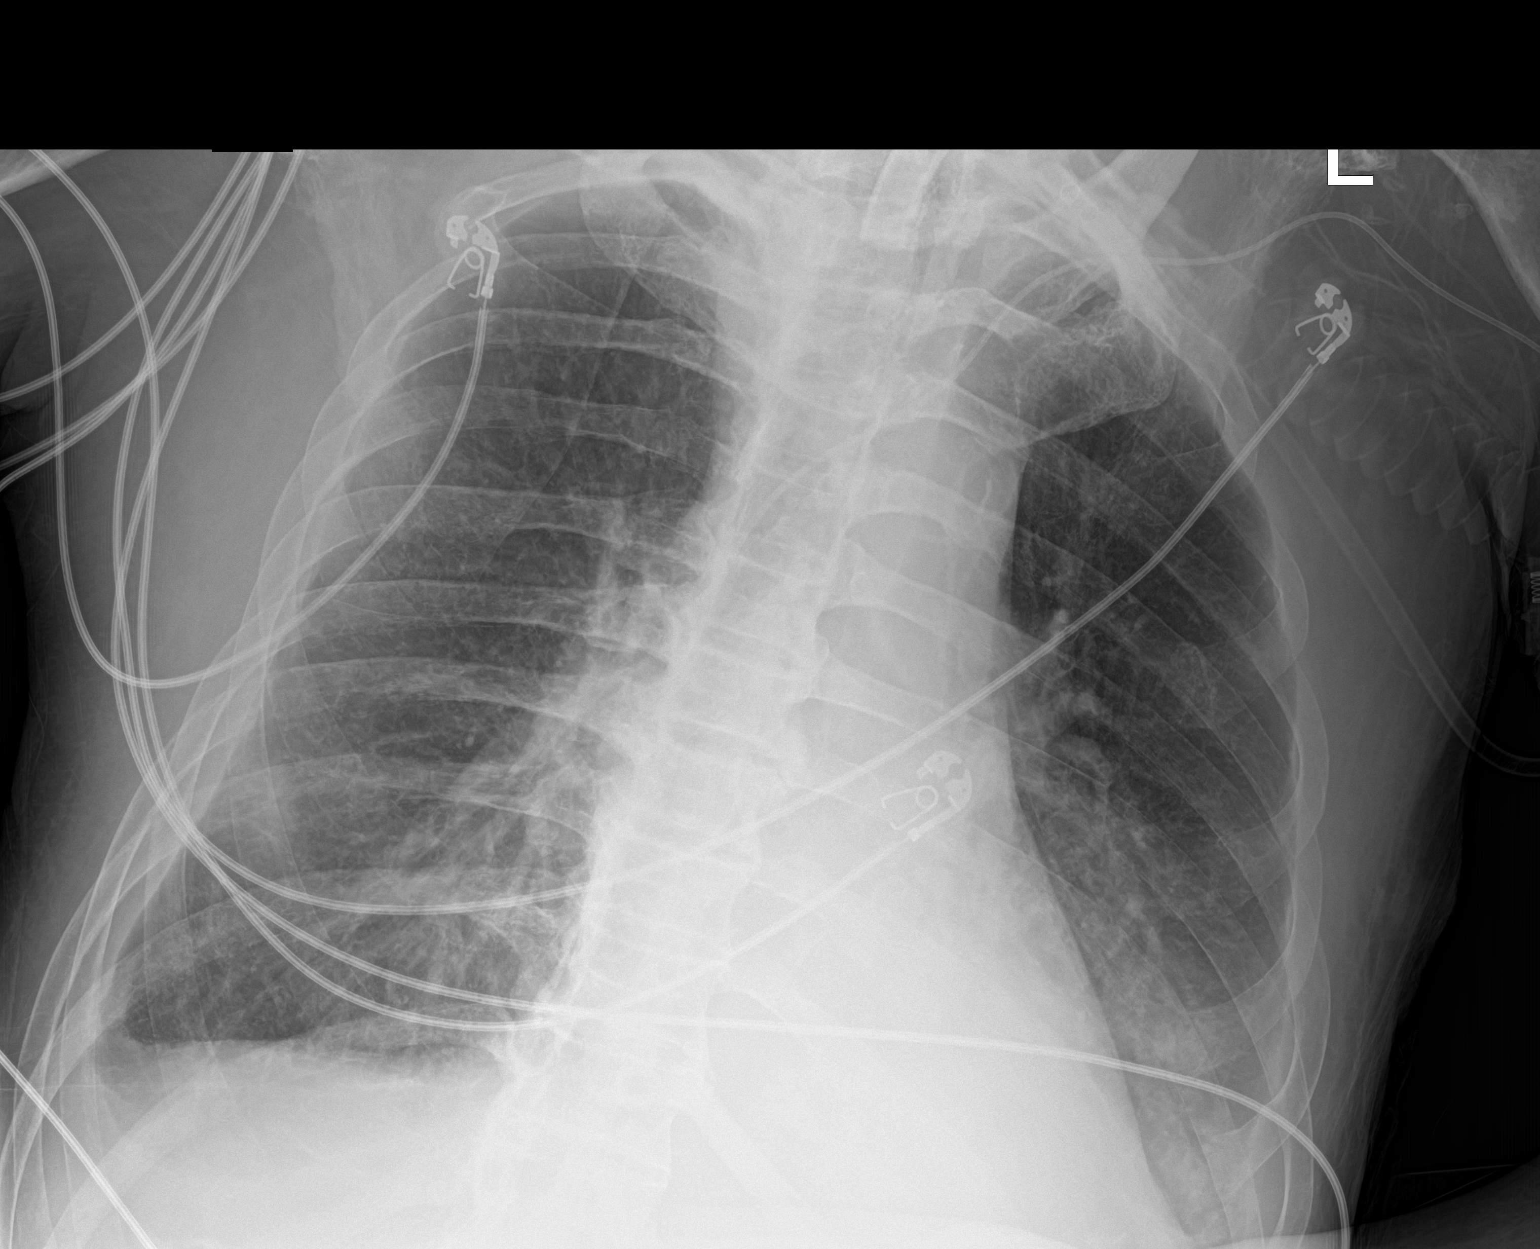

[1 of 1 positions shown; findings below may reference images not displayed]

FINDINGS: Tracheostomy tube and left PICC line stable position. Stable
cardiomegaly. Persistent left base atelectasis and infiltrate and
left-sided pleural effusion. Right base atelectasis/infiltrate and
small right pleural effusion noted on today's exam. No pneumothorax.
IMPRESSION: 1.  Tracheostomy tube and left PICC line in stable position.

2. Persistent left base atelectasis and infiltrate with left-sided
pleural effusion again noted. Similar findings on prior exam.

3. Right base atelectasis/infiltrate small right pleural effusion
noted on today's exam.

4.  Stable cardiomegaly.

## 2021-01-11 IMAGING — DX PORTABLE CHEST - 1 VIEW
1 series · 1 of 1 positions shown · non-contrast
Comparison: Chest radiograph from one day prior.

CLINICAL DATA: Respiratory failure with hypoxia

EXAM:
PORTABLE CHEST 1 VIEW

[chest ap]
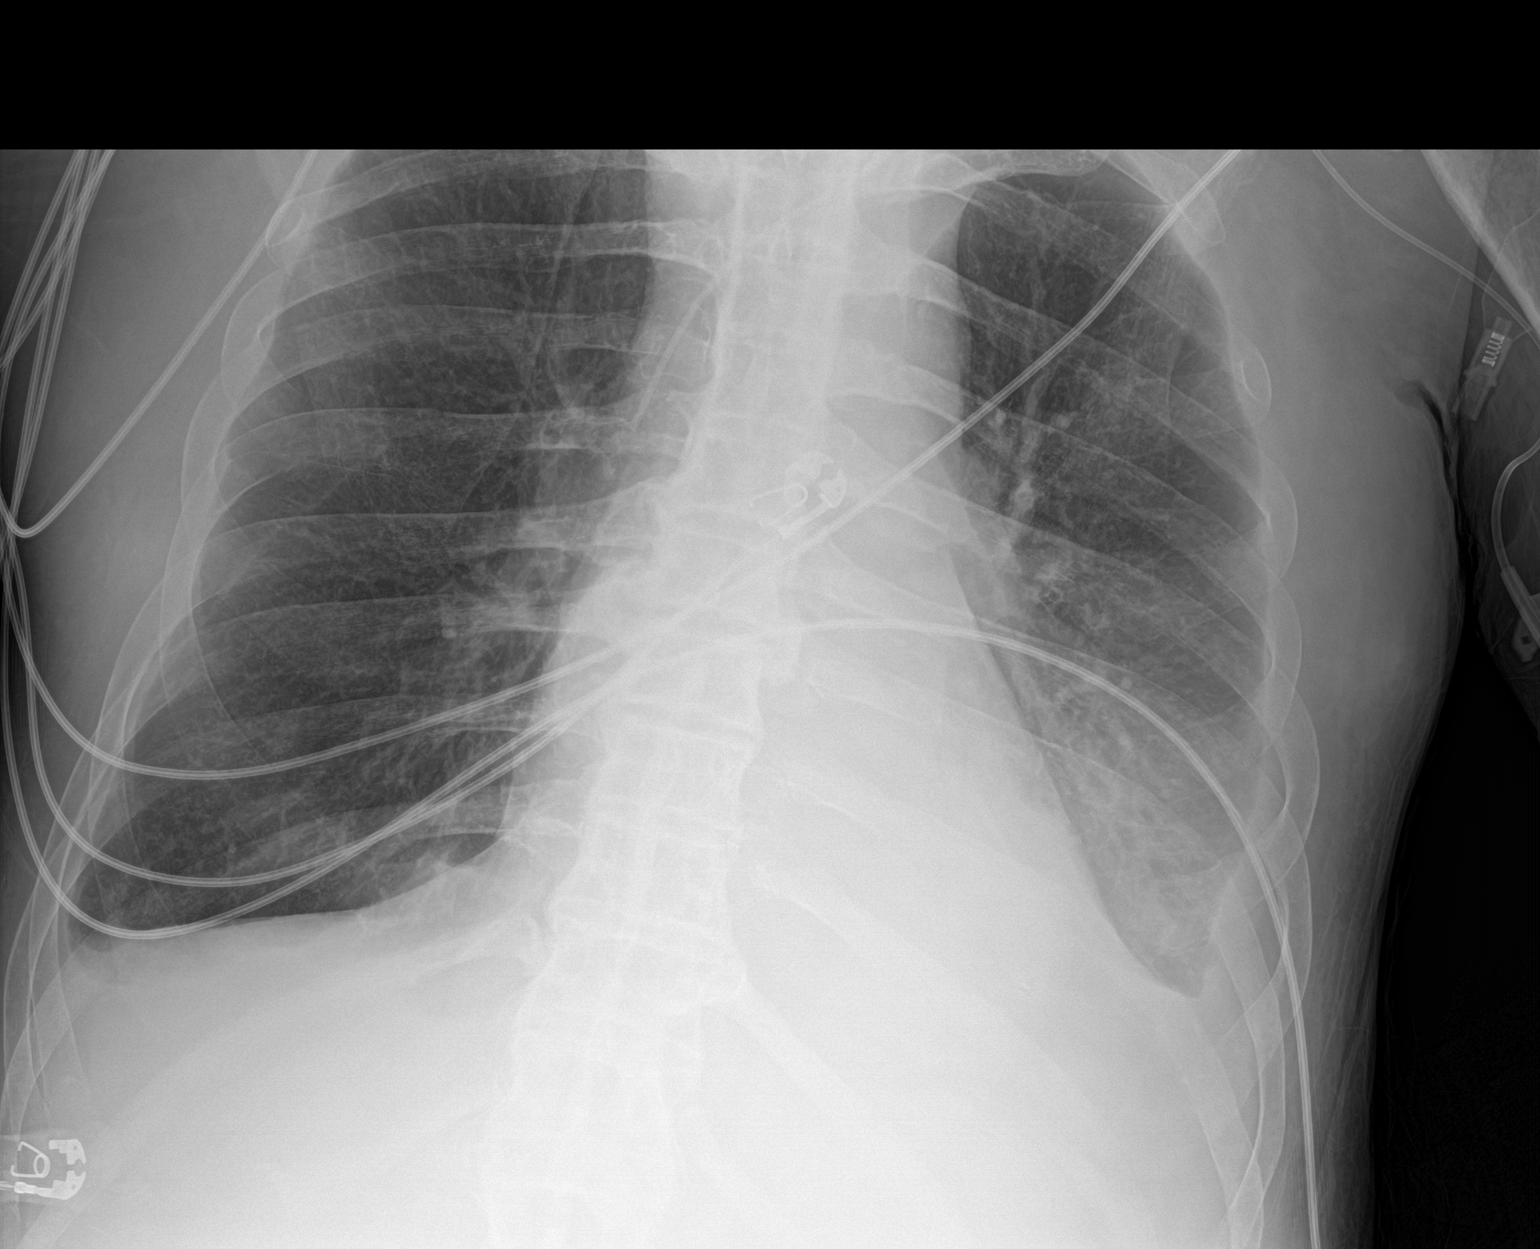

[1 of 1 positions shown; findings below may reference images not displayed]

FINDINGS: Tip of tracheostomy tube is seen overlying the tracheal air column
at the thoracic inlet. Left PICC terminates in middle third of the
SVC. Stable cardiomediastinal silhouette with top-normal heart size.
No pneumothorax. Small bilateral pleural effusions, left greater
than right, stable. No pulmonary edema. Left retrocardiac
consolidation is stable.
IMPRESSION: 1. Well-positioned support structures.  No pneumothorax.
2. Stable small bilateral pleural effusions, left greater than
right.
3. Stable left retrocardiac consolidation, favor atelectasis.

## 2021-01-13 IMAGING — DX PORTABLE CHEST - 1 VIEW
1 series · 1 of 1 positions shown · non-contrast
Comparison: Radiograph June 11, 2019.

CLINICAL DATA: Respiratory failure.

EXAM:
PORTABLE CHEST 1 VIEW

[chest ap]
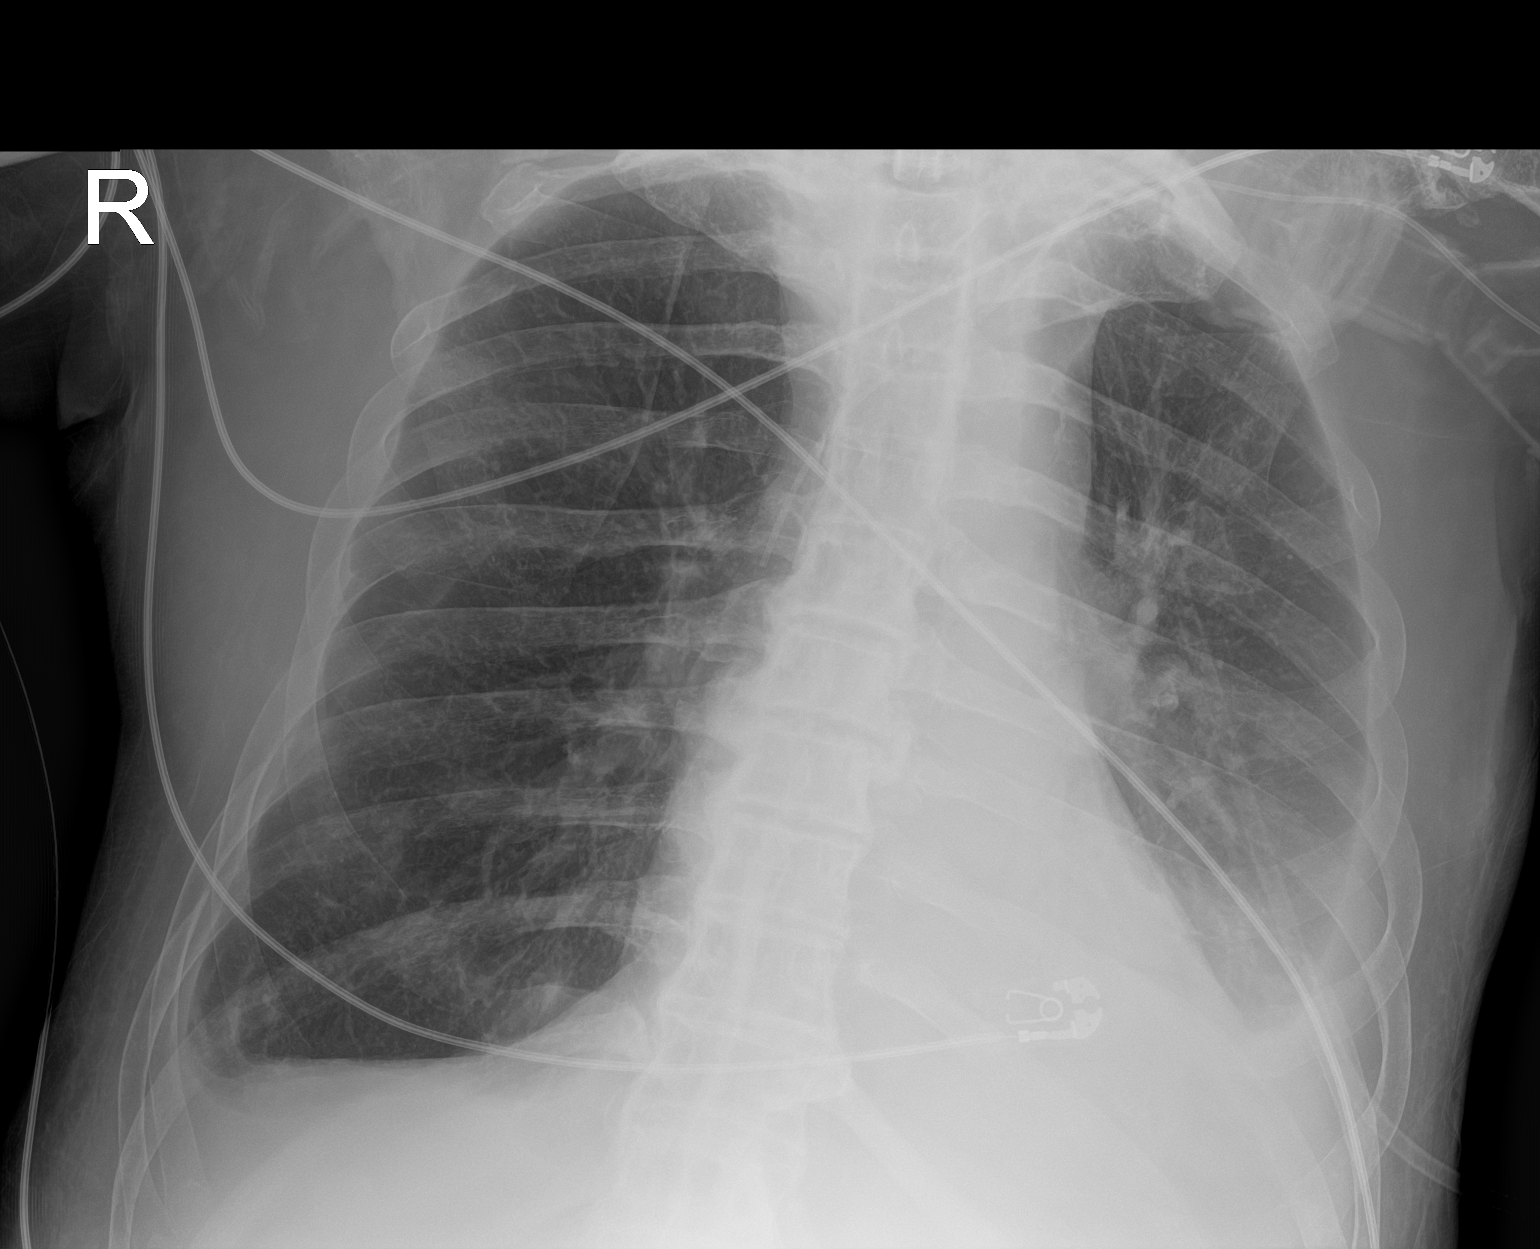

[1 of 1 positions shown; findings below may reference images not displayed]

FINDINGS: Stable tracheostomy tube. Stable left-sided PICC line is noted. No
pneumothorax is noted. Mild left pleural effusion is noted which is
enlarged compared to prior exam, with associated atelectasis or
infiltrate. Minimal right pleural effusion is noted with associated
subsegmental atelectasis. Bony thorax is unremarkable.
IMPRESSION: Mild left pleural effusion is noted which is enlarged compared to
prior exam, with associated atelectasis or infiltrate. Minimal right
pleural effusion is noted with associated subsegmental atelectasis.
Stable support apparatus.
